# Patient Record
Sex: Female | Born: 1935
Health system: Southern US, Community
[De-identification: ages and names within clinical notes are randomized; demographics above are authoritative.]

## PROBLEM LIST (undated history)

## (undated) DIAGNOSIS — J449 Chronic obstructive pulmonary disease, unspecified: Secondary | ICD-10-CM

## (undated) DIAGNOSIS — E079 Disorder of thyroid, unspecified: Secondary | ICD-10-CM

## (undated) DIAGNOSIS — I10 Essential (primary) hypertension: Secondary | ICD-10-CM

---

## 2015-02-22 DIAGNOSIS — E780 Pure hypercholesterolemia: Secondary | ICD-10-CM | POA: Diagnosis not present

## 2015-02-22 DIAGNOSIS — E039 Hypothyroidism, unspecified: Secondary | ICD-10-CM | POA: Diagnosis not present

## 2015-02-22 DIAGNOSIS — M1990 Unspecified osteoarthritis, unspecified site: Secondary | ICD-10-CM | POA: Diagnosis not present

## 2015-03-01 DIAGNOSIS — E780 Pure hypercholesterolemia: Secondary | ICD-10-CM | POA: Diagnosis not present

## 2015-03-01 DIAGNOSIS — R634 Abnormal weight loss: Secondary | ICD-10-CM | POA: Diagnosis not present

## 2015-03-01 DIAGNOSIS — Z1389 Encounter for screening for other disorder: Secondary | ICD-10-CM | POA: Diagnosis not present

## 2015-03-01 DIAGNOSIS — I493 Ventricular premature depolarization: Secondary | ICD-10-CM | POA: Diagnosis not present

## 2015-03-01 DIAGNOSIS — E039 Hypothyroidism, unspecified: Secondary | ICD-10-CM | POA: Diagnosis not present

## 2015-03-01 DIAGNOSIS — R011 Cardiac murmur, unspecified: Secondary | ICD-10-CM | POA: Diagnosis not present

## 2015-03-08 DIAGNOSIS — R0989 Other specified symptoms and signs involving the circulatory and respiratory systems: Secondary | ICD-10-CM | POA: Diagnosis not present

## 2015-03-08 DIAGNOSIS — R011 Cardiac murmur, unspecified: Secondary | ICD-10-CM | POA: Diagnosis not present

## 2015-03-08 DIAGNOSIS — I517 Cardiomegaly: Secondary | ICD-10-CM | POA: Diagnosis not present

## 2015-03-23 DIAGNOSIS — R011 Cardiac murmur, unspecified: Secondary | ICD-10-CM | POA: Diagnosis not present

## 2015-03-23 DIAGNOSIS — R931 Abnormal findings on diagnostic imaging of heart and coronary circulation: Secondary | ICD-10-CM | POA: Diagnosis not present

## 2015-03-23 DIAGNOSIS — I6523 Occlusion and stenosis of bilateral carotid arteries: Secondary | ICD-10-CM | POA: Diagnosis not present

## 2015-03-23 DIAGNOSIS — I517 Cardiomegaly: Secondary | ICD-10-CM | POA: Diagnosis not present

## 2015-03-24 DIAGNOSIS — I10 Essential (primary) hypertension: Secondary | ICD-10-CM | POA: Diagnosis not present

## 2015-04-21 DIAGNOSIS — I739 Peripheral vascular disease, unspecified: Secondary | ICD-10-CM | POA: Insufficient documentation

## 2015-04-21 DIAGNOSIS — I517 Cardiomegaly: Secondary | ICD-10-CM | POA: Insufficient documentation

## 2015-04-21 DIAGNOSIS — I10 Essential (primary) hypertension: Secondary | ICD-10-CM | POA: Insufficient documentation

## 2015-04-21 DIAGNOSIS — I160 Hypertensive urgency: Secondary | ICD-10-CM | POA: Insufficient documentation

## 2015-08-23 DIAGNOSIS — E039 Hypothyroidism, unspecified: Secondary | ICD-10-CM | POA: Diagnosis not present

## 2015-08-23 DIAGNOSIS — R011 Cardiac murmur, unspecified: Secondary | ICD-10-CM | POA: Diagnosis not present

## 2015-08-23 DIAGNOSIS — E7800 Pure hypercholesterolemia, unspecified: Secondary | ICD-10-CM | POA: Diagnosis not present

## 2015-08-23 DIAGNOSIS — M1990 Unspecified osteoarthritis, unspecified site: Secondary | ICD-10-CM | POA: Diagnosis not present

## 2015-08-30 DIAGNOSIS — R634 Abnormal weight loss: Secondary | ICD-10-CM | POA: Diagnosis not present

## 2015-08-30 DIAGNOSIS — R011 Cardiac murmur, unspecified: Secondary | ICD-10-CM | POA: Diagnosis not present

## 2015-08-30 DIAGNOSIS — I493 Ventricular premature depolarization: Secondary | ICD-10-CM | POA: Diagnosis not present

## 2015-08-30 DIAGNOSIS — E039 Hypothyroidism, unspecified: Secondary | ICD-10-CM | POA: Diagnosis not present

## 2015-08-30 DIAGNOSIS — Z13220 Encounter for screening for lipoid disorders: Secondary | ICD-10-CM | POA: Diagnosis not present

## 2015-08-30 DIAGNOSIS — Z23 Encounter for immunization: Secondary | ICD-10-CM | POA: Diagnosis not present

## 2015-10-21 DIAGNOSIS — Z7982 Long term (current) use of aspirin: Secondary | ICD-10-CM | POA: Diagnosis not present

## 2015-10-21 DIAGNOSIS — Z79899 Other long term (current) drug therapy: Secondary | ICD-10-CM | POA: Diagnosis not present

## 2015-10-21 DIAGNOSIS — E039 Hypothyroidism, unspecified: Secondary | ICD-10-CM | POA: Diagnosis not present

## 2015-10-21 DIAGNOSIS — J40 Bronchitis, not specified as acute or chronic: Secondary | ICD-10-CM | POA: Diagnosis not present

## 2015-10-21 DIAGNOSIS — R195 Other fecal abnormalities: Secondary | ICD-10-CM | POA: Diagnosis not present

## 2015-10-21 DIAGNOSIS — E7800 Pure hypercholesterolemia, unspecified: Secondary | ICD-10-CM | POA: Diagnosis not present

## 2015-10-21 DIAGNOSIS — R531 Weakness: Secondary | ICD-10-CM | POA: Diagnosis not present

## 2015-10-21 DIAGNOSIS — D509 Iron deficiency anemia, unspecified: Secondary | ICD-10-CM | POA: Diagnosis not present

## 2015-10-21 DIAGNOSIS — R05 Cough: Secondary | ICD-10-CM | POA: Diagnosis not present

## 2015-10-21 DIAGNOSIS — I10 Essential (primary) hypertension: Secondary | ICD-10-CM | POA: Diagnosis not present

## 2015-10-22 DIAGNOSIS — J441 Chronic obstructive pulmonary disease with (acute) exacerbation: Secondary | ICD-10-CM | POA: Diagnosis not present

## 2015-10-22 DIAGNOSIS — R110 Nausea: Secondary | ICD-10-CM | POA: Diagnosis not present

## 2015-10-22 DIAGNOSIS — D649 Anemia, unspecified: Secondary | ICD-10-CM | POA: Diagnosis not present

## 2015-11-11 DIAGNOSIS — D649 Anemia, unspecified: Secondary | ICD-10-CM | POA: Diagnosis not present

## 2016-01-13 DIAGNOSIS — I10 Essential (primary) hypertension: Secondary | ICD-10-CM | POA: Diagnosis not present

## 2016-02-22 DIAGNOSIS — D649 Anemia, unspecified: Secondary | ICD-10-CM | POA: Diagnosis not present

## 2016-02-22 DIAGNOSIS — J441 Chronic obstructive pulmonary disease with (acute) exacerbation: Secondary | ICD-10-CM | POA: Diagnosis not present

## 2016-02-22 DIAGNOSIS — E039 Hypothyroidism, unspecified: Secondary | ICD-10-CM | POA: Diagnosis not present

## 2016-02-22 DIAGNOSIS — E7800 Pure hypercholesterolemia, unspecified: Secondary | ICD-10-CM | POA: Diagnosis not present

## 2016-02-22 DIAGNOSIS — D509 Iron deficiency anemia, unspecified: Secondary | ICD-10-CM | POA: Diagnosis not present

## 2016-02-24 DIAGNOSIS — I493 Ventricular premature depolarization: Secondary | ICD-10-CM | POA: Diagnosis not present

## 2016-02-24 DIAGNOSIS — D509 Iron deficiency anemia, unspecified: Secondary | ICD-10-CM | POA: Diagnosis not present

## 2016-02-24 DIAGNOSIS — R634 Abnormal weight loss: Secondary | ICD-10-CM | POA: Diagnosis not present

## 2016-02-24 DIAGNOSIS — Z8679 Personal history of other diseases of the circulatory system: Secondary | ICD-10-CM | POA: Diagnosis not present

## 2016-02-24 DIAGNOSIS — R011 Cardiac murmur, unspecified: Secondary | ICD-10-CM | POA: Diagnosis not present

## 2016-02-24 DIAGNOSIS — E039 Hypothyroidism, unspecified: Secondary | ICD-10-CM | POA: Diagnosis not present

## 2016-02-24 DIAGNOSIS — L299 Pruritus, unspecified: Secondary | ICD-10-CM | POA: Diagnosis not present

## 2016-03-24 DIAGNOSIS — I6523 Occlusion and stenosis of bilateral carotid arteries: Secondary | ICD-10-CM | POA: Diagnosis not present

## 2016-03-24 DIAGNOSIS — R0989 Other specified symptoms and signs involving the circulatory and respiratory systems: Secondary | ICD-10-CM | POA: Diagnosis not present

## 2016-03-24 DIAGNOSIS — R938 Abnormal findings on diagnostic imaging of other specified body structures: Secondary | ICD-10-CM | POA: Diagnosis not present

## 2016-03-24 DIAGNOSIS — I10 Essential (primary) hypertension: Secondary | ICD-10-CM | POA: Diagnosis not present

## 2016-03-24 DIAGNOSIS — I739 Peripheral vascular disease, unspecified: Secondary | ICD-10-CM | POA: Diagnosis not present

## 2016-06-23 DIAGNOSIS — D649 Anemia, unspecified: Secondary | ICD-10-CM | POA: Diagnosis not present

## 2016-06-23 DIAGNOSIS — E039 Hypothyroidism, unspecified: Secondary | ICD-10-CM | POA: Diagnosis not present

## 2016-06-23 DIAGNOSIS — D509 Iron deficiency anemia, unspecified: Secondary | ICD-10-CM | POA: Diagnosis not present

## 2016-06-23 DIAGNOSIS — E7800 Pure hypercholesterolemia, unspecified: Secondary | ICD-10-CM | POA: Diagnosis not present

## 2016-06-23 DIAGNOSIS — J441 Chronic obstructive pulmonary disease with (acute) exacerbation: Secondary | ICD-10-CM | POA: Diagnosis not present

## 2016-06-27 DIAGNOSIS — Z681 Body mass index (BMI) 19 or less, adult: Secondary | ICD-10-CM | POA: Diagnosis not present

## 2016-06-27 DIAGNOSIS — Z23 Encounter for immunization: Secondary | ICD-10-CM | POA: Diagnosis not present

## 2016-06-27 DIAGNOSIS — Z1212 Encounter for screening for malignant neoplasm of rectum: Secondary | ICD-10-CM | POA: Diagnosis not present

## 2016-06-27 DIAGNOSIS — E039 Hypothyroidism, unspecified: Secondary | ICD-10-CM | POA: Diagnosis not present

## 2016-06-27 DIAGNOSIS — I493 Ventricular premature depolarization: Secondary | ICD-10-CM | POA: Diagnosis not present

## 2016-06-27 DIAGNOSIS — R634 Abnormal weight loss: Secondary | ICD-10-CM | POA: Diagnosis not present

## 2016-06-27 DIAGNOSIS — R011 Cardiac murmur, unspecified: Secondary | ICD-10-CM | POA: Diagnosis not present

## 2016-10-24 DIAGNOSIS — J441 Chronic obstructive pulmonary disease with (acute) exacerbation: Secondary | ICD-10-CM | POA: Diagnosis not present

## 2016-10-24 DIAGNOSIS — E039 Hypothyroidism, unspecified: Secondary | ICD-10-CM | POA: Diagnosis not present

## 2016-10-24 DIAGNOSIS — D509 Iron deficiency anemia, unspecified: Secondary | ICD-10-CM | POA: Diagnosis not present

## 2016-10-24 DIAGNOSIS — E7800 Pure hypercholesterolemia, unspecified: Secondary | ICD-10-CM | POA: Diagnosis not present

## 2016-10-24 DIAGNOSIS — D649 Anemia, unspecified: Secondary | ICD-10-CM | POA: Diagnosis not present

## 2016-10-24 DIAGNOSIS — Z8679 Personal history of other diseases of the circulatory system: Secondary | ICD-10-CM | POA: Diagnosis not present

## 2016-10-27 DIAGNOSIS — L299 Pruritus, unspecified: Secondary | ICD-10-CM | POA: Diagnosis not present

## 2016-10-27 DIAGNOSIS — R011 Cardiac murmur, unspecified: Secondary | ICD-10-CM | POA: Diagnosis not present

## 2016-10-27 DIAGNOSIS — I493 Ventricular premature depolarization: Secondary | ICD-10-CM | POA: Diagnosis not present

## 2016-10-27 DIAGNOSIS — E7800 Pure hypercholesterolemia, unspecified: Secondary | ICD-10-CM | POA: Diagnosis not present

## 2016-10-27 DIAGNOSIS — E039 Hypothyroidism, unspecified: Secondary | ICD-10-CM | POA: Diagnosis not present

## 2016-10-27 DIAGNOSIS — I10 Essential (primary) hypertension: Secondary | ICD-10-CM | POA: Diagnosis not present

## 2016-10-27 DIAGNOSIS — R634 Abnormal weight loss: Secondary | ICD-10-CM | POA: Diagnosis not present

## 2016-10-27 DIAGNOSIS — D509 Iron deficiency anemia, unspecified: Secondary | ICD-10-CM | POA: Diagnosis not present

## 2017-03-08 DIAGNOSIS — D509 Iron deficiency anemia, unspecified: Secondary | ICD-10-CM | POA: Diagnosis not present

## 2017-03-08 DIAGNOSIS — J441 Chronic obstructive pulmonary disease with (acute) exacerbation: Secondary | ICD-10-CM | POA: Diagnosis not present

## 2017-03-08 DIAGNOSIS — E039 Hypothyroidism, unspecified: Secondary | ICD-10-CM | POA: Diagnosis not present

## 2017-03-08 DIAGNOSIS — R011 Cardiac murmur, unspecified: Secondary | ICD-10-CM | POA: Diagnosis not present

## 2017-03-08 DIAGNOSIS — E7800 Pure hypercholesterolemia, unspecified: Secondary | ICD-10-CM | POA: Diagnosis not present

## 2017-03-08 DIAGNOSIS — I10 Essential (primary) hypertension: Secondary | ICD-10-CM | POA: Diagnosis not present

## 2017-03-14 DIAGNOSIS — E7800 Pure hypercholesterolemia, unspecified: Secondary | ICD-10-CM | POA: Diagnosis not present

## 2017-03-14 DIAGNOSIS — I493 Ventricular premature depolarization: Secondary | ICD-10-CM | POA: Diagnosis not present

## 2017-03-14 DIAGNOSIS — Z23 Encounter for immunization: Secondary | ICD-10-CM | POA: Diagnosis not present

## 2017-03-14 DIAGNOSIS — L299 Pruritus, unspecified: Secondary | ICD-10-CM | POA: Diagnosis not present

## 2017-03-14 DIAGNOSIS — R634 Abnormal weight loss: Secondary | ICD-10-CM | POA: Diagnosis not present

## 2017-03-14 DIAGNOSIS — Z681 Body mass index (BMI) 19 or less, adult: Secondary | ICD-10-CM | POA: Diagnosis not present

## 2017-03-14 DIAGNOSIS — R011 Cardiac murmur, unspecified: Secondary | ICD-10-CM | POA: Diagnosis not present

## 2017-03-14 DIAGNOSIS — E039 Hypothyroidism, unspecified: Secondary | ICD-10-CM | POA: Diagnosis not present

## 2017-09-10 DIAGNOSIS — D509 Iron deficiency anemia, unspecified: Secondary | ICD-10-CM | POA: Diagnosis not present

## 2017-09-10 DIAGNOSIS — D649 Anemia, unspecified: Secondary | ICD-10-CM | POA: Diagnosis not present

## 2017-09-10 DIAGNOSIS — J441 Chronic obstructive pulmonary disease with (acute) exacerbation: Secondary | ICD-10-CM | POA: Diagnosis not present

## 2017-09-10 DIAGNOSIS — Z0000 Encounter for general adult medical examination without abnormal findings: Secondary | ICD-10-CM | POA: Diagnosis not present

## 2017-09-10 DIAGNOSIS — E7800 Pure hypercholesterolemia, unspecified: Secondary | ICD-10-CM | POA: Diagnosis not present

## 2017-09-10 DIAGNOSIS — I10 Essential (primary) hypertension: Secondary | ICD-10-CM | POA: Diagnosis not present

## 2017-09-10 DIAGNOSIS — E039 Hypothyroidism, unspecified: Secondary | ICD-10-CM | POA: Diagnosis not present

## 2017-09-10 DIAGNOSIS — Z681 Body mass index (BMI) 19 or less, adult: Secondary | ICD-10-CM | POA: Diagnosis not present

## 2017-09-10 DIAGNOSIS — I493 Ventricular premature depolarization: Secondary | ICD-10-CM | POA: Diagnosis not present

## 2017-09-13 DIAGNOSIS — Z681 Body mass index (BMI) 19 or less, adult: Secondary | ICD-10-CM | POA: Diagnosis not present

## 2017-09-13 DIAGNOSIS — L299 Pruritus, unspecified: Secondary | ICD-10-CM | POA: Diagnosis not present

## 2017-09-13 DIAGNOSIS — R011 Cardiac murmur, unspecified: Secondary | ICD-10-CM | POA: Diagnosis not present

## 2017-09-13 DIAGNOSIS — I10 Essential (primary) hypertension: Secondary | ICD-10-CM | POA: Diagnosis not present

## 2017-09-13 DIAGNOSIS — Z8679 Personal history of other diseases of the circulatory system: Secondary | ICD-10-CM | POA: Diagnosis not present

## 2017-09-13 DIAGNOSIS — R7301 Impaired fasting glucose: Secondary | ICD-10-CM | POA: Diagnosis not present

## 2017-09-13 DIAGNOSIS — D509 Iron deficiency anemia, unspecified: Secondary | ICD-10-CM | POA: Diagnosis not present

## 2017-09-13 DIAGNOSIS — E7800 Pure hypercholesterolemia, unspecified: Secondary | ICD-10-CM | POA: Diagnosis not present

## 2017-09-17 DIAGNOSIS — I6523 Occlusion and stenosis of bilateral carotid arteries: Secondary | ICD-10-CM | POA: Diagnosis not present

## 2017-09-17 DIAGNOSIS — I493 Ventricular premature depolarization: Secondary | ICD-10-CM | POA: Diagnosis not present

## 2018-03-06 DIAGNOSIS — E039 Hypothyroidism, unspecified: Secondary | ICD-10-CM | POA: Diagnosis not present

## 2018-03-06 DIAGNOSIS — R011 Cardiac murmur, unspecified: Secondary | ICD-10-CM | POA: Diagnosis not present

## 2018-03-06 DIAGNOSIS — E7800 Pure hypercholesterolemia, unspecified: Secondary | ICD-10-CM | POA: Diagnosis not present

## 2018-03-06 DIAGNOSIS — D509 Iron deficiency anemia, unspecified: Secondary | ICD-10-CM | POA: Diagnosis not present

## 2018-03-06 DIAGNOSIS — J441 Chronic obstructive pulmonary disease with (acute) exacerbation: Secondary | ICD-10-CM | POA: Diagnosis not present

## 2018-03-06 DIAGNOSIS — Z8679 Personal history of other diseases of the circulatory system: Secondary | ICD-10-CM | POA: Diagnosis not present

## 2018-03-06 DIAGNOSIS — M1990 Unspecified osteoarthritis, unspecified site: Secondary | ICD-10-CM | POA: Diagnosis not present

## 2018-03-12 DIAGNOSIS — I10 Essential (primary) hypertension: Secondary | ICD-10-CM | POA: Diagnosis not present

## 2018-03-12 DIAGNOSIS — E7800 Pure hypercholesterolemia, unspecified: Secondary | ICD-10-CM | POA: Diagnosis not present

## 2018-03-12 DIAGNOSIS — L299 Pruritus, unspecified: Secondary | ICD-10-CM | POA: Diagnosis not present

## 2018-03-12 DIAGNOSIS — E039 Hypothyroidism, unspecified: Secondary | ICD-10-CM | POA: Diagnosis not present

## 2018-03-12 DIAGNOSIS — Z681 Body mass index (BMI) 19 or less, adult: Secondary | ICD-10-CM | POA: Diagnosis not present

## 2018-03-12 DIAGNOSIS — R011 Cardiac murmur, unspecified: Secondary | ICD-10-CM | POA: Diagnosis not present

## 2018-03-12 DIAGNOSIS — R634 Abnormal weight loss: Secondary | ICD-10-CM | POA: Diagnosis not present

## 2018-03-12 DIAGNOSIS — I493 Ventricular premature depolarization: Secondary | ICD-10-CM | POA: Diagnosis not present

## 2018-09-06 DIAGNOSIS — I10 Essential (primary) hypertension: Secondary | ICD-10-CM | POA: Diagnosis not present

## 2018-09-06 DIAGNOSIS — D509 Iron deficiency anemia, unspecified: Secondary | ICD-10-CM | POA: Diagnosis not present

## 2018-09-06 DIAGNOSIS — R011 Cardiac murmur, unspecified: Secondary | ICD-10-CM | POA: Diagnosis not present

## 2018-09-06 DIAGNOSIS — R7301 Impaired fasting glucose: Secondary | ICD-10-CM | POA: Diagnosis not present

## 2018-09-06 DIAGNOSIS — E039 Hypothyroidism, unspecified: Secondary | ICD-10-CM | POA: Diagnosis not present

## 2018-09-06 DIAGNOSIS — E7800 Pure hypercholesterolemia, unspecified: Secondary | ICD-10-CM | POA: Diagnosis not present

## 2018-09-06 DIAGNOSIS — J441 Chronic obstructive pulmonary disease with (acute) exacerbation: Secondary | ICD-10-CM | POA: Diagnosis not present

## 2018-09-09 DIAGNOSIS — I493 Ventricular premature depolarization: Secondary | ICD-10-CM | POA: Diagnosis not present

## 2018-09-09 DIAGNOSIS — R011 Cardiac murmur, unspecified: Secondary | ICD-10-CM | POA: Diagnosis not present

## 2018-09-09 DIAGNOSIS — L299 Pruritus, unspecified: Secondary | ICD-10-CM | POA: Diagnosis not present

## 2018-09-09 DIAGNOSIS — J449 Chronic obstructive pulmonary disease, unspecified: Secondary | ICD-10-CM | POA: Diagnosis not present

## 2018-09-09 DIAGNOSIS — E039 Hypothyroidism, unspecified: Secondary | ICD-10-CM | POA: Diagnosis not present

## 2018-09-09 DIAGNOSIS — Z681 Body mass index (BMI) 19 or less, adult: Secondary | ICD-10-CM | POA: Diagnosis not present

## 2018-09-09 DIAGNOSIS — I10 Essential (primary) hypertension: Secondary | ICD-10-CM | POA: Diagnosis not present

## 2018-09-09 DIAGNOSIS — R05 Cough: Secondary | ICD-10-CM | POA: Diagnosis not present

## 2019-03-06 DIAGNOSIS — R7301 Impaired fasting glucose: Secondary | ICD-10-CM | POA: Diagnosis not present

## 2019-03-06 DIAGNOSIS — E039 Hypothyroidism, unspecified: Secondary | ICD-10-CM | POA: Diagnosis not present

## 2019-03-06 DIAGNOSIS — E7800 Pure hypercholesterolemia, unspecified: Secondary | ICD-10-CM | POA: Diagnosis not present

## 2019-03-06 DIAGNOSIS — J449 Chronic obstructive pulmonary disease, unspecified: Secondary | ICD-10-CM | POA: Diagnosis not present

## 2019-03-06 DIAGNOSIS — I10 Essential (primary) hypertension: Secondary | ICD-10-CM | POA: Diagnosis not present

## 2019-03-06 DIAGNOSIS — D509 Iron deficiency anemia, unspecified: Secondary | ICD-10-CM | POA: Diagnosis not present

## 2019-03-11 DIAGNOSIS — E039 Hypothyroidism, unspecified: Secondary | ICD-10-CM | POA: Diagnosis not present

## 2019-03-11 DIAGNOSIS — Z681 Body mass index (BMI) 19 or less, adult: Secondary | ICD-10-CM | POA: Diagnosis not present

## 2019-03-11 DIAGNOSIS — J449 Chronic obstructive pulmonary disease, unspecified: Secondary | ICD-10-CM | POA: Diagnosis not present

## 2019-03-11 DIAGNOSIS — L299 Pruritus, unspecified: Secondary | ICD-10-CM | POA: Diagnosis not present

## 2019-03-11 DIAGNOSIS — R011 Cardiac murmur, unspecified: Secondary | ICD-10-CM | POA: Diagnosis not present

## 2019-03-11 DIAGNOSIS — I493 Ventricular premature depolarization: Secondary | ICD-10-CM | POA: Diagnosis not present

## 2019-03-11 DIAGNOSIS — I10 Essential (primary) hypertension: Secondary | ICD-10-CM | POA: Diagnosis not present

## 2019-03-11 DIAGNOSIS — M791 Myalgia, unspecified site: Secondary | ICD-10-CM | POA: Diagnosis not present

## 2019-08-15 DIAGNOSIS — E782 Mixed hyperlipidemia: Secondary | ICD-10-CM | POA: Diagnosis not present

## 2019-08-15 DIAGNOSIS — I10 Essential (primary) hypertension: Secondary | ICD-10-CM | POA: Diagnosis not present

## 2019-09-04 DIAGNOSIS — R7301 Impaired fasting glucose: Secondary | ICD-10-CM | POA: Diagnosis not present

## 2019-09-04 DIAGNOSIS — E039 Hypothyroidism, unspecified: Secondary | ICD-10-CM | POA: Diagnosis not present

## 2019-09-04 DIAGNOSIS — I10 Essential (primary) hypertension: Secondary | ICD-10-CM | POA: Diagnosis not present

## 2019-09-04 DIAGNOSIS — J441 Chronic obstructive pulmonary disease with (acute) exacerbation: Secondary | ICD-10-CM | POA: Diagnosis not present

## 2019-09-04 DIAGNOSIS — E7800 Pure hypercholesterolemia, unspecified: Secondary | ICD-10-CM | POA: Diagnosis not present

## 2019-09-08 DIAGNOSIS — I10 Essential (primary) hypertension: Secondary | ICD-10-CM | POA: Diagnosis not present

## 2019-09-08 DIAGNOSIS — R05 Cough: Secondary | ICD-10-CM | POA: Diagnosis not present

## 2019-09-08 DIAGNOSIS — Z8679 Personal history of other diseases of the circulatory system: Secondary | ICD-10-CM | POA: Diagnosis not present

## 2019-09-08 DIAGNOSIS — L299 Pruritus, unspecified: Secondary | ICD-10-CM | POA: Diagnosis not present

## 2019-09-08 DIAGNOSIS — D509 Iron deficiency anemia, unspecified: Secondary | ICD-10-CM | POA: Diagnosis not present

## 2019-09-08 DIAGNOSIS — R011 Cardiac murmur, unspecified: Secondary | ICD-10-CM | POA: Diagnosis not present

## 2019-09-08 DIAGNOSIS — I493 Ventricular premature depolarization: Secondary | ICD-10-CM | POA: Diagnosis not present

## 2019-09-08 DIAGNOSIS — J449 Chronic obstructive pulmonary disease, unspecified: Secondary | ICD-10-CM | POA: Diagnosis not present

## 2019-09-10 ENCOUNTER — Other Ambulatory Visit: Payer: Self-pay

## 2019-09-15 DIAGNOSIS — I10 Essential (primary) hypertension: Secondary | ICD-10-CM | POA: Diagnosis not present

## 2019-09-15 DIAGNOSIS — E7800 Pure hypercholesterolemia, unspecified: Secondary | ICD-10-CM | POA: Diagnosis not present

## 2019-12-12 DIAGNOSIS — I10 Essential (primary) hypertension: Secondary | ICD-10-CM | POA: Diagnosis not present

## 2019-12-12 DIAGNOSIS — J449 Chronic obstructive pulmonary disease, unspecified: Secondary | ICD-10-CM | POA: Diagnosis not present

## 2019-12-12 DIAGNOSIS — E7801 Familial hypercholesterolemia: Secondary | ICD-10-CM | POA: Diagnosis not present

## 2019-12-12 DIAGNOSIS — E039 Hypothyroidism, unspecified: Secondary | ICD-10-CM | POA: Diagnosis not present

## 2020-03-04 DIAGNOSIS — E039 Hypothyroidism, unspecified: Secondary | ICD-10-CM | POA: Diagnosis not present

## 2020-03-04 DIAGNOSIS — E7800 Pure hypercholesterolemia, unspecified: Secondary | ICD-10-CM | POA: Diagnosis not present

## 2020-03-04 DIAGNOSIS — J441 Chronic obstructive pulmonary disease with (acute) exacerbation: Secondary | ICD-10-CM | POA: Diagnosis not present

## 2020-03-04 DIAGNOSIS — D649 Anemia, unspecified: Secondary | ICD-10-CM | POA: Diagnosis not present

## 2020-03-04 DIAGNOSIS — R7301 Impaired fasting glucose: Secondary | ICD-10-CM | POA: Diagnosis not present

## 2020-03-04 DIAGNOSIS — J449 Chronic obstructive pulmonary disease, unspecified: Secondary | ICD-10-CM | POA: Diagnosis not present

## 2020-03-04 DIAGNOSIS — I10 Essential (primary) hypertension: Secondary | ICD-10-CM | POA: Diagnosis not present

## 2020-03-04 DIAGNOSIS — R011 Cardiac murmur, unspecified: Secondary | ICD-10-CM | POA: Diagnosis not present

## 2020-03-08 DIAGNOSIS — I10 Essential (primary) hypertension: Secondary | ICD-10-CM | POA: Diagnosis not present

## 2020-03-08 DIAGNOSIS — Z0000 Encounter for general adult medical examination without abnormal findings: Secondary | ICD-10-CM | POA: Diagnosis not present

## 2020-03-08 DIAGNOSIS — R011 Cardiac murmur, unspecified: Secondary | ICD-10-CM | POA: Diagnosis not present

## 2020-03-08 DIAGNOSIS — E039 Hypothyroidism, unspecified: Secondary | ICD-10-CM | POA: Diagnosis not present

## 2020-03-08 DIAGNOSIS — I493 Ventricular premature depolarization: Secondary | ICD-10-CM | POA: Diagnosis not present

## 2020-03-08 DIAGNOSIS — Z681 Body mass index (BMI) 19 or less, adult: Secondary | ICD-10-CM | POA: Diagnosis not present

## 2020-03-08 DIAGNOSIS — R634 Abnormal weight loss: Secondary | ICD-10-CM | POA: Diagnosis not present

## 2020-03-08 DIAGNOSIS — J449 Chronic obstructive pulmonary disease, unspecified: Secondary | ICD-10-CM | POA: Diagnosis not present

## 2020-08-18 DIAGNOSIS — E039 Hypothyroidism, unspecified: Secondary | ICD-10-CM | POA: Diagnosis not present

## 2020-08-18 DIAGNOSIS — I10 Essential (primary) hypertension: Secondary | ICD-10-CM | POA: Diagnosis not present

## 2020-08-18 DIAGNOSIS — E559 Vitamin D deficiency, unspecified: Secondary | ICD-10-CM | POA: Diagnosis not present

## 2020-08-18 DIAGNOSIS — J449 Chronic obstructive pulmonary disease, unspecified: Secondary | ICD-10-CM | POA: Diagnosis not present

## 2020-08-18 DIAGNOSIS — R413 Other amnesia: Secondary | ICD-10-CM | POA: Diagnosis not present

## 2020-08-18 DIAGNOSIS — Z681 Body mass index (BMI) 19 or less, adult: Secondary | ICD-10-CM | POA: Diagnosis not present

## 2020-08-18 DIAGNOSIS — G629 Polyneuropathy, unspecified: Secondary | ICD-10-CM | POA: Diagnosis not present

## 2020-09-02 DIAGNOSIS — I10 Essential (primary) hypertension: Secondary | ICD-10-CM | POA: Diagnosis not present

## 2020-09-02 DIAGNOSIS — Z8679 Personal history of other diseases of the circulatory system: Secondary | ICD-10-CM | POA: Diagnosis not present

## 2020-09-02 DIAGNOSIS — R011 Cardiac murmur, unspecified: Secondary | ICD-10-CM | POA: Diagnosis not present

## 2020-09-02 DIAGNOSIS — E039 Hypothyroidism, unspecified: Secondary | ICD-10-CM | POA: Diagnosis not present

## 2020-09-02 DIAGNOSIS — E7801 Familial hypercholesterolemia: Secondary | ICD-10-CM | POA: Diagnosis not present

## 2020-09-02 DIAGNOSIS — R7301 Impaired fasting glucose: Secondary | ICD-10-CM | POA: Diagnosis not present

## 2020-09-02 DIAGNOSIS — I493 Ventricular premature depolarization: Secondary | ICD-10-CM | POA: Diagnosis not present

## 2020-09-02 DIAGNOSIS — J449 Chronic obstructive pulmonary disease, unspecified: Secondary | ICD-10-CM | POA: Diagnosis not present

## 2020-12-24 DIAGNOSIS — I10 Essential (primary) hypertension: Secondary | ICD-10-CM | POA: Diagnosis not present

## 2020-12-24 DIAGNOSIS — E7849 Other hyperlipidemia: Secondary | ICD-10-CM | POA: Diagnosis not present

## 2020-12-24 DIAGNOSIS — R5383 Other fatigue: Secondary | ICD-10-CM | POA: Diagnosis not present

## 2020-12-24 DIAGNOSIS — E782 Mixed hyperlipidemia: Secondary | ICD-10-CM | POA: Diagnosis not present

## 2020-12-24 DIAGNOSIS — R011 Cardiac murmur, unspecified: Secondary | ICD-10-CM | POA: Diagnosis not present

## 2020-12-24 DIAGNOSIS — R413 Other amnesia: Secondary | ICD-10-CM | POA: Diagnosis not present

## 2020-12-24 DIAGNOSIS — Z681 Body mass index (BMI) 19 or less, adult: Secondary | ICD-10-CM | POA: Diagnosis not present

## 2021-01-06 ENCOUNTER — Inpatient Hospital Stay (HOSPITAL_COMMUNITY): Payer: Medicare HMO

## 2021-01-06 ENCOUNTER — Inpatient Hospital Stay (HOSPITAL_COMMUNITY)
Admission: EM | Admit: 2021-01-06 | Discharge: 2021-01-09 | DRG: 071 | Disposition: A | Discharge status: Home Health | Payer: Medicare HMO | Attending: Internal Medicine | Admitting: Internal Medicine

## 2021-01-06 ENCOUNTER — Emergency Department (HOSPITAL_COMMUNITY): Payer: Medicare HMO

## 2021-01-06 ENCOUNTER — Encounter (HOSPITAL_COMMUNITY): Payer: Self-pay | Admitting: *Deleted

## 2021-01-06 ENCOUNTER — Other Ambulatory Visit: Payer: Self-pay

## 2021-01-06 DIAGNOSIS — Z87891 Personal history of nicotine dependence: Secondary | ICD-10-CM | POA: Diagnosis not present

## 2021-01-06 DIAGNOSIS — E785 Hyperlipidemia, unspecified: Secondary | ICD-10-CM | POA: Diagnosis not present

## 2021-01-06 DIAGNOSIS — E079 Disorder of thyroid, unspecified: Secondary | ICD-10-CM | POA: Diagnosis present

## 2021-01-06 DIAGNOSIS — G9341 Metabolic encephalopathy: Secondary | ICD-10-CM

## 2021-01-06 DIAGNOSIS — Z66 Do not resuscitate: Secondary | ICD-10-CM | POA: Diagnosis present

## 2021-01-06 DIAGNOSIS — R4182 Altered mental status, unspecified: Secondary | ICD-10-CM | POA: Diagnosis not present

## 2021-01-06 DIAGNOSIS — R630 Anorexia: Secondary | ICD-10-CM | POA: Diagnosis present

## 2021-01-06 DIAGNOSIS — F0390 Unspecified dementia without behavioral disturbance: Secondary | ICD-10-CM | POA: Diagnosis present

## 2021-01-06 DIAGNOSIS — E876 Hypokalemia: Secondary | ICD-10-CM | POA: Diagnosis present

## 2021-01-06 DIAGNOSIS — Z20822 Contact with and (suspected) exposure to covid-19: Secondary | ICD-10-CM | POA: Diagnosis present

## 2021-01-06 DIAGNOSIS — R531 Weakness: Secondary | ICD-10-CM | POA: Diagnosis not present

## 2021-01-06 DIAGNOSIS — E039 Hypothyroidism, unspecified: Secondary | ICD-10-CM | POA: Diagnosis present

## 2021-01-06 DIAGNOSIS — J449 Chronic obstructive pulmonary disease, unspecified: Secondary | ICD-10-CM | POA: Diagnosis not present

## 2021-01-06 DIAGNOSIS — E871 Hypo-osmolality and hyponatremia: Secondary | ICD-10-CM | POA: Diagnosis present

## 2021-01-06 DIAGNOSIS — I10 Essential (primary) hypertension: Secondary | ICD-10-CM | POA: Diagnosis not present

## 2021-01-06 HISTORY — DX: Disorder of thyroid, unspecified: E07.9

## 2021-01-06 HISTORY — DX: Metabolic encephalopathy: G93.41

## 2021-01-06 HISTORY — DX: Essential (primary) hypertension: I10

## 2021-01-06 HISTORY — DX: Chronic obstructive pulmonary disease, unspecified: J44.9

## 2021-01-06 LAB — CBC WITH DIFFERENTIAL/PLATELET
Abs Immature Granulocytes: 0.03 10*3/uL (ref 0.00–0.07)
Basophils Absolute: 0 10*3/uL (ref 0.0–0.1)
Basophils Relative: 1 %
Eosinophils Absolute: 0 10*3/uL (ref 0.0–0.5)
Eosinophils Relative: 1 %
HCT: 33.3 % — ABNORMAL LOW (ref 36.0–46.0)
Hemoglobin: 12.4 g/dL (ref 12.0–15.0)
Immature Granulocytes: 1 %
Lymphocytes Relative: 10 %
Lymphs Abs: 0.5 10*3/uL — ABNORMAL LOW (ref 0.7–4.0)
MCH: 33.9 pg (ref 26.0–34.0)
MCHC: 37.2 g/dL — ABNORMAL HIGH (ref 30.0–36.0)
MCV: 91 fL (ref 80.0–100.0)
Monocytes Absolute: 0.6 10*3/uL (ref 0.1–1.0)
Monocytes Relative: 10 %
Neutro Abs: 4.4 10*3/uL (ref 1.7–7.7)
Neutrophils Relative %: 77 %
Platelets: 209 10*3/uL (ref 150–400)
RBC: 3.66 MIL/uL — ABNORMAL LOW (ref 3.87–5.11)
RDW: 11.7 % (ref 11.5–15.5)
WBC: 5.6 10*3/uL (ref 4.0–10.5)
nRBC: 0 % (ref 0.0–0.2)

## 2021-01-06 LAB — COMPREHENSIVE METABOLIC PANEL
ALT: 28 U/L (ref 0–44)
AST: 41 U/L (ref 15–41)
Albumin: 4.1 g/dL (ref 3.5–5.0)
Alkaline Phosphatase: 34 U/L — ABNORMAL LOW (ref 38–126)
Anion gap: 10 (ref 5–15)
BUN: 11 mg/dL (ref 8–23)
CO2: 25 mmol/L (ref 22–32)
Calcium: 8.8 mg/dL — ABNORMAL LOW (ref 8.9–10.3)
Chloride: 80 mmol/L — ABNORMAL LOW (ref 98–111)
Creatinine, Ser: 0.75 mg/dL (ref 0.44–1.00)
GFR, Estimated: >60 mL/min (ref >60)
Glucose, Bld: 98 mg/dL (ref 70–99)
Potassium: 3.2 mmol/L — ABNORMAL LOW (ref 3.5–5.1)
Sodium: 115 mmol/L — CL (ref 135–145)
Total Bilirubin: 1.2 mg/dL (ref 0.3–1.2)
Total Protein: 6.3 g/dL — ABNORMAL LOW (ref 6.5–8.1)

## 2021-01-06 LAB — TROPONIN I (HIGH SENSITIVITY)
Troponin I (High Sensitivity): 44 ng/L — ABNORMAL HIGH (ref <18)
Troponin I (High Sensitivity): 39 ng/L — ABNORMAL HIGH (ref <18)

## 2021-01-06 LAB — URINALYSIS, ROUTINE W REFLEX MICROSCOPIC
Bilirubin Urine: NEGATIVE
Glucose, UA: NEGATIVE mg/dL
Hgb urine dipstick: NEGATIVE
Ketones, ur: 5 mg/dL — AB
Leukocytes,Ua: NEGATIVE
Nitrite: NEGATIVE
Protein, ur: NEGATIVE mg/dL
Specific Gravity, Urine: 1.004 — ABNORMAL LOW (ref 1.005–1.030)
pH: 7 (ref 5.0–8.0)

## 2021-01-06 LAB — OSMOLALITY: Osmolality: 245 mOsm/kg — CL (ref 275–295)

## 2021-01-06 LAB — SODIUM: Sodium: 118 mmol/L — CL (ref 135–145)

## 2021-01-06 LAB — TSH: TSH: 2.774 u[IU]/mL (ref 0.350–4.500)

## 2021-01-06 MED ORDER — IPRATROPIUM-ALBUTEROL 0.5-2.5 (3) MG/3ML IN SOLN: 3.0000 mL | Freq: Four times a day (QID) | RESPIRATORY_TRACT | Status: DC | PRN

## 2021-01-06 MED ORDER — LABETALOL HCL 5 MG/ML IV SOLN: 10.0000 mg | INTRAVENOUS | Status: DC | PRN

## 2021-01-06 MED ORDER — SODIUM CHLORIDE 0.9 % IV BOLUS (SEPSIS)
1000.0000 mL | Freq: Once | INTRAVENOUS | Status: AC
  Administered 2021-01-06: 1000 mL via INTRAVENOUS

## 2021-01-06 MED ORDER — SODIUM CHLORIDE 0.9 % IV BOLUS
500.0000 mL | Freq: Once | INTRAVENOUS | Status: AC
  Administered 2021-01-06: 500 mL via INTRAVENOUS

## 2021-01-06 MED ORDER — ONDANSETRON HCL 4 MG/2ML IJ SOLN
4.0000 mg | Freq: Four times a day (QID) | INTRAMUSCULAR | Status: DC | PRN
  Administered 2021-01-07: 4 mg via INTRAVENOUS
  Filled 2021-01-06: qty 2

## 2021-01-06 MED ORDER — ACETAMINOPHEN 325 MG PO TABS
650.0000 mg | ORAL_TABLET | Freq: Four times a day (QID) | ORAL | Status: DC | PRN
  Administered 2021-01-07: 650 mg via ORAL
  Filled 2021-01-06: qty 2

## 2021-01-06 MED ORDER — ENOXAPARIN SODIUM 40 MG/0.4ML ~~LOC~~ SOLN
40.0000 mg | SUBCUTANEOUS | Status: DC
  Administered 2021-01-06 – 2021-01-08 (×3): 40 mg via SUBCUTANEOUS
  Filled 2021-01-06 (×4): qty 0.4

## 2021-01-06 MED ORDER — ACETAMINOPHEN 650 MG RE SUPP: 650.0000 mg | Freq: Four times a day (QID) | RECTAL | Status: DC | PRN

## 2021-01-06 MED ORDER — SODIUM CHLORIDE 0.9 % IV SOLN: INTRAVENOUS | Status: DC

## 2021-01-06 MED ORDER — ONDANSETRON HCL 4 MG PO TABS: 4.0000 mg | ORAL_TABLET | Freq: Four times a day (QID) | ORAL | Status: DC | PRN

## 2021-01-06 MED ORDER — POTASSIUM CHLORIDE CRYS ER 20 MEQ PO TBCR
40.0000 meq | EXTENDED_RELEASE_TABLET | Freq: Once | ORAL | Status: AC
  Administered 2021-01-06: 40 meq via ORAL
  Filled 2021-01-06: qty 2

## 2021-01-06 MED ORDER — MELATONIN 3 MG PO TABS
6.0000 mg | ORAL_TABLET | Freq: Every day | ORAL | Status: DC
  Administered 2021-01-06 – 2021-01-08 (×3): 6 mg via ORAL
  Filled 2021-01-06 (×3): qty 2

## 2021-01-06 NOTE — ED Notes (Signed)
Patient to CT.

## 2021-01-06 NOTE — Progress Notes (Signed)
Caswell Corwin made aware of sodium level via Amion chat. No new orders at this time.

## 2021-01-06 NOTE — Progress Notes (Signed)
Date and time results received: 01/06/21 1500 (use smartphrase ".now" to insert current time)  Test: blood osmolality  Critical Value: 245  Name of Provider Notified: Maurilio Lovely DO  Orders Received? Or Actions Taken?: none received

## 2021-01-06 NOTE — ED Provider Notes (Signed)
[facility] EMERGENCY DEPARTMENT Provider Note   CSN: [id number] Arrival date & time: 01/06/21  1212     History Chief Complaint  Patient presents with  . Weakness    Theresa Bush is a 87 y.o. female.  Patient presents with weakness that progressed over the last week.  And some confusion.  The history is provided by a relative. No language interpreter was used.  Weakness Severity:  Moderate Onset quality:  Sudden Timing:  Constant Progression:  Worsening Chronicity:  Recurrent Context: not alcohol use   Relieved by:  Nothing Worsened by:  Nothing Ineffective treatments:  None tried Associated symptoms: no abdominal pain, no chest pain, no cough, no diarrhea, no frequency, no headaches and no seizures        Past Medical History:  Diagnosis Date  . COPD (chronic obstructive pulmonary disease) (HCC)   . Hypertension   . Thyroid disease     There are no problems to display for this patient.      OB History   No obstetric history on file.     No family history on file.  Social History   Tobacco Use  . Smoking status: Former Games developer  . Smokeless tobacco: Never Used  Substance Use Topics  . Alcohol use: Not Currently  . Drug use: Never    Home Medications Prior to Admission medications   Not on File    Allergies    Patient has no known allergies.  Review of Systems   Review of Systems  Constitutional: Negative for appetite change and fatigue.  HENT: Negative for congestion, ear discharge and sinus pressure.   Eyes: Negative for discharge.  Respiratory: Negative for cough.   Cardiovascular: Negative for chest pain.  Gastrointestinal: Negative for abdominal pain and diarrhea.  Genitourinary: Negative for frequency and hematuria.  Musculoskeletal: Negative for back pain.  Skin: Negative for rash.  Neurological: Positive for weakness. Negative for seizures and headaches.  Psychiatric/Behavioral: Negative for hallucinations.    Physical  Exam Updated Vital Signs BP (!) 166/69   Pulse 74   Temp 97.9 F (36.6 C)   Resp 15   SpO2 100%   Physical Exam Vitals and nursing note reviewed.  Constitutional:      Appearance: She is well-developed.  HENT:     Head: Normocephalic.     Nose: Nose normal.  Eyes:     General: No scleral icterus.    Conjunctiva/sclera: Conjunctivae normal.  Neck:     Thyroid: No thyromegaly.  Cardiovascular:     Rate and Rhythm: Normal rate and regular rhythm.     Heart sounds: No murmur heard. No friction rub. No gallop.   Pulmonary:     Breath sounds: No stridor. No wheezing or rales.  Chest:     Chest wall: No tenderness.  Abdominal:     General: There is no distension.     Tenderness: There is no abdominal tenderness. There is no rebound.  Musculoskeletal:        General: Normal range of motion.     Cervical back: Neck supple.  Lymphadenopathy:     Cervical: No cervical adenopathy.  Skin:    Findings: No erythema or rash.  Neurological:     Mental Status: She is alert.     Motor: No abnormal muscle tone.     Coordination: Coordination normal.     Comments: Oriented to person place only  Psychiatric:        Behavior: Behavior normal.  ED Results / Procedures / Treatments   Labs (all labs ordered are listed, but only abnormal results are displayed) Labs Reviewed  CBC WITH DIFFERENTIAL/PLATELET - Abnormal; Notable for the following components:      Result Value   RBC 3.66 (*)    HCT 33.3 (*)    MCHC 37.2 (*)    Lymphs Abs 0.5 (*)    All other components within normal limits  COMPREHENSIVE METABOLIC PANEL - Abnormal; Notable for the following components:   Sodium 115 (*)    Potassium 3.2 (*)    Chloride 80 (*)    Calcium 8.8 (*)    Total Protein 6.3 (*)    Alkaline Phosphatase 34 (*)    All other components within normal limits  TROPONIN I (HIGH SENSITIVITY) - Abnormal; Notable for the following components:   Troponin I (High Sensitivity) 44 (*)    All other  components within normal limits  TSH  URINALYSIS, ROUTINE W REFLEX MICROSCOPIC  TROPONIN I (HIGH SENSITIVITY)    EKG None  Radiology DG Chest Port 1 View  Result Date: 01/06/2021 CLINICAL DATA:  Weakness. EXAM: PORTABLE CHEST 1 VIEW COMPARISON:  None. FINDINGS: The heart size and mediastinal contours are within normal limits. Both lungs are clear. The visualized skeletal structures are unremarkable. IMPRESSION: No active disease. Aortic Atherosclerosis (ICD10-I70.0). Electronically Signed   By: Lupita Raider M.D.   On: 01/06/2021 13:45    Procedures Procedures   Medications Ordered in ED Medications  sodium chloride 0.9 % bolus 1,000 mL (1,000 mLs Intravenous New Bag/Given 01/06/21 1411)  sodium chloride 0.9 % bolus 500 mL (0 mLs Intravenous Stopped 01/06/21 1403)    ED Course  I have reviewed the triage vital signs and the nursing notes.  Pertinent labs & imaging results that were available during my care of the patient were reviewed by me and considered in my medical decision making (see chart for details). CRITICAL CARE Performed by: Bethann Berkshire Total critical care time: 45 minutes Critical care time was exclusive of separately billable procedures and treating other patients. Critical care was necessary to treat or prevent imminent or life-threatening deterioration. Critical care was time spent personally by me on the following activities: development of treatment plan with patient and/or surrogate as well as nursing, discussions with consultants, evaluation of patient's response to treatment, examination of patient, obtaining history from patient or surrogate, ordering and performing treatments and interventions, ordering and review of laboratory studies, ordering and review of radiographic studies, pulse oximetry and re-evaluation of patient's condition.    MDM Rules/Calculators/A&P                            patient with hyponatremia.  She will be admitted to  medicine Final Clinical Impression(s) / ED Diagnoses Final diagnoses:  Hyponatremia    Rx / DC Orders ED Discharge Orders    None       Bethann Berkshire, MD 01/06/21 1417

## 2021-01-06 NOTE — H&P (Signed)
History and Physical    Theresa Bush YCX:[id number] DOB: [birth year] DOA: 01/06/2021  PCP: Estanislado Pandy, MD   Patient coming from: Home  Chief Complaint: Weakness/confusion  HPI: Theresa Bush is a 87 y.o. female with medical history significant for COPD, hypertension, dyslipidemia, hypothyroidism, and likely dementia who presented to the ED with worsening confusion, weakness, nausea that has not been persisting for approximately 2 weeks according to daughter at bedside.  She has had poor appetite and little oral intake recently. She denies abdominal pain or vomiting and continues to have bowel movements on a daily basis.  No fevers or chills noted.  She has been taking her home medications regularly and was recently noted to have low sodium levels in her PCP office.  She was encouraged to drink some Gatorade and reduce her water intake at home.  She denies any chest pain, shortness of breath, or dysuria.  Patient provides some of the history and she is minimally confused with daughter at bedside.   ED Course: Stable vital signs noted, but now with some elevated blood pressure readings at bedside.  Sodium level is 115 and potassium is 3.2.  Bolus of 1.5 L normal saline initiated.  Chest x-ray with no acute findings and urine analysis is pending.  Review of Systems: Difficult to fully obtain given patient condition.  Past Medical History:  Diagnosis Date  . COPD (chronic obstructive pulmonary disease) (HCC)   . Hypertension   . Thyroid disease      reports that she has quit smoking. She has never used smokeless tobacco. She reports previous alcohol use. She reports that she does not use drugs.  No Known Allergies  No family history on file.  Prior to Admission medications   Not on File    Physical Exam: Vitals:   01/06/21 1227 01/06/21 1300 01/06/21 1330  BP: (!) 184/84 (!) 150/73 (!) 166/69  Pulse: 73 69 74  Resp: 18 (!) 21 15  Temp: 97.9 F (36.6 C)    SpO2:  97% 99% 100%    Constitutional: NAD, calm, comfortable, confused Vitals:   01/06/21 1227 01/06/21 1300 01/06/21 1330  BP: (!) 184/84 (!) 150/73 (!) 166/69  Pulse: 73 69 74  Resp: 18 (!) 21 15  Temp: 97.9 F (36.6 C)    SpO2: 97% 99% 100%   Eyes: lids and conjunctivae normal Neck: normal, supple Respiratory: clear to auscultation bilaterally. Normal respiratory effort. No accessory muscle use.  Cardiovascular: Regular rate and rhythm, no murmurs. Abdomen: no tenderness, no distention. Bowel sounds positive.  Musculoskeletal:  No edema. Skin: no rashes, lesions, ulcers.  Psychiatric: Flat affect  Labs on Admission: I have personally reviewed following labs and imaging studies  CBC: Recent Labs  Lab 01/06/21 1242  WBC 5.6  NEUTROABS 4.4  HGB 12.4  HCT 33.3*  MCV 91.0  PLT 209   Basic Metabolic Panel: Recent Labs  Lab 01/06/21 1242  NA 115*  K 3.2*  CL 80*  CO2 25  GLUCOSE 98  BUN 11  CREATININE 0.75  CALCIUM 8.8*   GFR: CrCl cannot be calculated (Unknown ideal weight.). Liver Function Tests: Recent Labs  Lab 01/06/21 1242  AST 41  ALT 28  ALKPHOS 34*  BILITOT 1.2  PROT 6.3*  ALBUMIN 4.1   No results for input(s): LIPASE, AMYLASE in the last 168 hours. No results for input(s): AMMONIA in the last 168 hours. Coagulation Profile: No results for input(s): INR, PROTIME in the last 168 hours.  Cardiac Enzymes: No results for input(s): CKTOTAL, CKMB, CKMBINDEX, TROPONINI in the last 168 hours. BNP (last 3 results) No results for input(s): PROBNP in the last 8760 hours. HbA1C: No results for input(s): HGBA1C in the last 72 hours. CBG: No results for input(s): GLUCAP in the last 168 hours. Lipid Profile: No results for input(s): CHOL, HDL, LDLCALC, TRIG, CHOLHDL, LDLDIRECT in the last 72 hours. Thyroid Function Tests: Recent Labs    01/06/21 1242  TSH 2.774   Anemia Panel: No results for input(s): VITAMINB12, FOLATE, FERRITIN, TIBC, IRON,  RETICCTPCT in the last 72 hours. Urine analysis:    Component Value Date/Time   COLORURINE STRAW (A) 01/06/2021 1404   APPEARANCEUR CLEAR 01/06/2021 1404   LABSPEC 1.004 (L) 01/06/2021 1404   PHURINE 7.0 01/06/2021 1404   GLUCOSEU NEGATIVE 01/06/2021 1404   HGBUR NEGATIVE 01/06/2021 1404   BILIRUBINUR NEGATIVE 01/06/2021 1404   KETONESUR 5 (A) 01/06/2021 1404   PROTEINUR NEGATIVE 01/06/2021 1404   NITRITE NEGATIVE 01/06/2021 1404   LEUKOCYTESUR NEGATIVE 01/06/2021 1404    Radiological Exams on Admission: DG Chest Port 1 View  Result Date: 01/06/2021 CLINICAL DATA:  Weakness. EXAM: PORTABLE CHEST 1 VIEW COMPARISON:  None. FINDINGS: The heart size and mediastinal contours are within normal limits. Both lungs are clear. The visualized skeletal structures are unremarkable. IMPRESSION: No active disease. Aortic Atherosclerosis (ICD10-I70.0). Electronically Signed   By: Lupita Raider M.D.   On: 01/06/2021 13:45    Assessment/Plan Active Problems:   Acute metabolic encephalopathy    Acute encephalopathy-likely metabolic -Secondary to hyponatremia -Check head CT for full evaluation -TSH 2.774  Acute hyponatremia -Likely secondary to poor oral intake along with HCTZ use at home -Hold further HCTZ -Regular diet -IV normal saline infusion -Monitor sodium every 6 hours for approximately 8 mEq improvement each 24 hours -TSH 2.774 -Plan to check serum and urine osmolarity and urine sodium  Mild hypokalemia -Replete and reevaluate in a.m.  History of hypertension -Currently with high blood pressure readings -Restart home lisinopril, but avoid HCTZ -Labetalol as needed for significant elevations  History of COPD -DuoNebs as needed -No acute bronchospasms currently  Hypothyroidism -Continue Synthroid  Possible dementia versus MCI -Lives by herself and is fully independent   DVT prophylaxis: Lovenox Code Status: DNR Family Communication: Daughter at bedside Disposition  Plan:Admit for treatment of hyponatremia/encephalopathy Consults called:None Admission status: Inpatient, MedSurg  Diane Hanel D Sherryll Burger DO [facility]  If 7PM-7AM, please contact night-coverage [email or web address]  01/06/2021, 2:32 PM

## 2021-01-06 NOTE — ED Notes (Signed)
Date and time results received: 01/06/21 1401   Test: NA Critical Value: 115  Name of Provider Notified: [place]   Orders Received? Or Actions Taken?: see N.O.

## 2021-01-06 NOTE — Progress Notes (Signed)
Date and time results received: 01/06/21 1845 (use smartphrase ".now" to insert current time)  Test: sodium  Critical Value: 118  Name of Provider Notified: Lona Millard DO  Orders Received? Or Actions Taken?: No orders received

## 2021-01-06 NOTE — ED Notes (Signed)
Patient denies pain and is resting comfortably.  

## 2021-01-06 NOTE — ED Triage Notes (Signed)
weakness and nausea for 2 weeks

## 2021-01-07 LAB — SODIUM
Sodium: 121 mmol/L — ABNORMAL LOW (ref 135–145)
Sodium: 121 mmol/L — ABNORMAL LOW (ref 135–145)
Sodium: 123 mmol/L — ABNORMAL LOW (ref 135–145)
Sodium: 123 mmol/L — ABNORMAL LOW (ref 135–145)

## 2021-01-07 LAB — CBC
HCT: 30.2 % — ABNORMAL LOW (ref 36.0–46.0)
Hemoglobin: 11.1 g/dL — ABNORMAL LOW (ref 12.0–15.0)
MCH: 34 pg (ref 26.0–34.0)
MCHC: 36.8 g/dL — ABNORMAL HIGH (ref 30.0–36.0)
MCV: 92.6 fL (ref 80.0–100.0)
Platelets: 204 10*3/uL (ref 150–400)
RBC: 3.26 MIL/uL — ABNORMAL LOW (ref 3.87–5.11)
RDW: 11.7 % (ref 11.5–15.5)
WBC: 6 10*3/uL (ref 4.0–10.5)
nRBC: 0 % (ref 0.0–0.2)

## 2021-01-07 LAB — BASIC METABOLIC PANEL
Anion gap: 8 (ref 5–15)
BUN: 10 mg/dL (ref 8–23)
CO2: 19 mmol/L — ABNORMAL LOW (ref 22–32)
Calcium: 8.2 mg/dL — ABNORMAL LOW (ref 8.9–10.3)
Chloride: 94 mmol/L — ABNORMAL LOW (ref 98–111)
Creatinine, Ser: 0.58 mg/dL (ref 0.44–1.00)
GFR, Estimated: >60 mL/min (ref >60)
Glucose, Bld: 80 mg/dL (ref 70–99)
Potassium: 3.7 mmol/L (ref 3.5–5.1)
Sodium: 121 mmol/L — ABNORMAL LOW (ref 135–145)

## 2021-01-07 LAB — MAGNESIUM: Magnesium: 1.6 mg/dL — ABNORMAL LOW (ref 1.7–2.4)

## 2021-01-07 LAB — SARS CORONAVIRUS 2 (TAT 6-24 HRS): SARS Coronavirus 2: NEGATIVE

## 2021-01-07 MED ORDER — ASPIRIN EC 81 MG PO TBEC
81.0000 mg | DELAYED_RELEASE_TABLET | Freq: Every day | ORAL | Status: DC
  Administered 2021-01-07 – 2021-01-09 (×3): 81 mg via ORAL
  Filled 2021-01-07 (×3): qty 1

## 2021-01-07 MED ORDER — LOPERAMIDE HCL 2 MG PO CAPS
2.0000 mg | ORAL_CAPSULE | ORAL | Status: DC | PRN
  Administered 2021-01-07 – 2021-01-08 (×3): 2 mg via ORAL
  Filled 2021-01-07 (×4): qty 1

## 2021-01-07 MED ORDER — HALOPERIDOL LACTATE 5 MG/ML IJ SOLN
1.0000 mg | Freq: Four times a day (QID) | INTRAMUSCULAR | Status: DC | PRN
  Administered 2021-01-07 – 2021-01-09 (×2): 1 mg via INTRAVENOUS
  Filled 2021-01-07 (×2): qty 1

## 2021-01-07 MED ORDER — VITAMIN D 25 MCG (1000 UNIT) PO TABS
2000.0000 [IU] | ORAL_TABLET | Freq: Every day | ORAL | Status: DC
  Administered 2021-01-07 – 2021-01-09 (×3): 2000 [IU] via ORAL
  Filled 2021-01-07 (×3): qty 2

## 2021-01-07 MED ORDER — FLUTICASONE-UMECLIDIN-VILANT 100-62.5-25 MCG/INH IN AEPB: 1.0000 | INHALATION_SPRAY | Freq: Every day | RESPIRATORY_TRACT | Status: DC

## 2021-01-07 MED ORDER — UMECLIDINIUM BROMIDE 62.5 MCG/INH IN AEPB
1.0000 | INHALATION_SPRAY | Freq: Every day | RESPIRATORY_TRACT | Status: DC
  Administered 2021-01-07 – 2021-01-09 (×3): 1 via RESPIRATORY_TRACT
  Filled 2021-01-07: qty 7

## 2021-01-07 MED ORDER — FLUTICASONE FUROATE-VILANTEROL 100-25 MCG/INH IN AEPB
1.0000 | INHALATION_SPRAY | Freq: Every day | RESPIRATORY_TRACT | Status: DC
  Administered 2021-01-07 – 2021-01-09 (×3): 1 via RESPIRATORY_TRACT
  Filled 2021-01-07: qty 28

## 2021-01-07 MED ORDER — LISINOPRIL 10 MG PO TABS
20.0000 mg | ORAL_TABLET | Freq: Every day | ORAL | Status: DC
  Administered 2021-01-07 – 2021-01-09 (×3): 20 mg via ORAL
  Filled 2021-01-07 (×3): qty 2

## 2021-01-07 MED ORDER — MAGNESIUM SULFATE 2 GM/50ML IV SOLN
2.0000 g | Freq: Once | INTRAVENOUS | Status: AC
  Administered 2021-01-07: 2 g via INTRAVENOUS
  Filled 2021-01-07: qty 50

## 2021-01-07 MED ORDER — PRAVASTATIN SODIUM 10 MG PO TABS
20.0000 mg | ORAL_TABLET | Freq: Every evening | ORAL | Status: DC
  Administered 2021-01-07 – 2021-01-08 (×2): 20 mg via ORAL
  Filled 2021-01-07 (×3): qty 2

## 2021-01-07 MED ORDER — LEVOTHYROXINE SODIUM 75 MCG PO TABS
75.0000 ug | ORAL_TABLET | Freq: Every day | ORAL | Status: DC
  Administered 2021-01-08 – 2021-01-09 (×2): 75 ug via ORAL
  Filled 2021-01-07 (×2): qty 1

## 2021-01-07 MED ORDER — HALOPERIDOL LACTATE 5 MG/ML IJ SOLN
2.0000 mg | Freq: Four times a day (QID) | INTRAMUSCULAR | Status: DC | PRN
  Filled 2021-01-07: qty 1

## 2021-01-07 MED ORDER — ADULT MULTIVITAMIN W/MINERALS CH
ORAL_TABLET | Freq: Every day | ORAL | Status: DC
  Administered 2021-01-07 – 2021-01-09 (×3): 1 via ORAL
  Filled 2021-01-07 (×3): qty 1

## 2021-01-07 NOTE — Progress Notes (Signed)
PROGRESS NOTE    IRIDIAN READER  YSA:[id number] DOB: 1925-12-20 DOA: 01/06/2021 PCP: Estanislado Pandy, MD   Brief Narrative:  Theresa Bush is a 87 y.o. female with medical history significant for COPD, hypertension, dyslipidemia, hypothyroidism, and likely dementia who presented to the ED with worsening confusion, weakness, nausea that has not been persisting for approximately 2 weeks according to daughter at bedside.  She has had poor appetite and little oral intake recently.  Patient admitted with acute metabolic encephalopathy in the setting of acute hyponatremia.  Noted to have HCTZ use at home  Assessment & Plan:   Active Problems:   Acute metabolic encephalopathy   Acute encephalopathy-likely metabolic, improving -Secondary to hyponatremia -Head CT with signs of atrophy but no acute CVA -TSH 2.774  Acute hyponatremia-improving -Likely secondary to poor oral intake along with HCTZ use at home -Hold further HCTZ -Regular diet -IV normal saline infusion to continue -Monitor sodium every 6 hours for approximately 8 mEq improvement each 24 hours -TSH 2.774 -Serum osmolarity noted to be low -Urine osmolarity and sodium level pending  Mild hypomagnesemia -Replete and reevaluate in a.m.  History of hypertension -Currently with high blood pressure readings -Restart home lisinopril 20 mg, but hold HCTZ -Labetalol as needed for significant elevations  History of COPD -DuoNebs as needed -No acute bronchospasms currently  Hypothyroidism -Continue Synthroid  Possible dementia versus MCI -Lives by herself and is fully independent   DVT prophylaxis:Lovenox Code Status: DNR Family Communication: Spoke with son-in-law at bedside 3/25 Disposition Plan:  Status is: Inpatient  Remains inpatient appropriate because:Persistent severe electrolyte disturbances, Altered mental status, IV treatments appropriate due to intensity of illness or inability to take PO  and Inpatient level of care appropriate due to severity of illness   Dispo: The patient is from: Home              Anticipated d/c is to: SNF              Patient currently is not medically stable to d/c.   Difficult to place patient No   Consultants:   None  Procedures:   See below  Antimicrobials:   None   Subjective: Patient seen and evaluated today with increased alertness and some agitation overnight.  She still has poor appetite and does not want to eat.  Objective: Vitals:   01/06/21 1718 01/06/21 1721 01/06/21 2221 01/07/21 0541  BP: (!) 166/69 (!) 167/74 (!) 145/71 115/61  Pulse: 74 79 66 74  Resp: 15 20 20 18   Temp: 97.9 F (36.6 C) 98 F (36.7 C) 98.2 F (36.8 C) 98.1 F (36.7 C)  TempSrc: Oral Oral  Oral  SpO2:  100% 97% 97%  Weight: 46.9 kg     Height: 5' (1.524 m)       Intake/Output Summary (Last 24 hours) at 01/07/2021 0953 Last data filed at 01/07/2021 0341 Gross per 24 hour  Intake 1463.8 ml  Output --  Net 1463.8 ml   Filed Weights   01/06/21 1718  Weight: 46.9 kg    Examination:  General exam: Appears minimally agitated Respiratory system: Clear to auscultation. Respiratory effort normal. Cardiovascular system: S1 & S2 heard, RRR.  Gastrointestinal system: Abdomen is soft Central nervous system: Alert and awake Extremities: No edema Skin: No significant lesions noted Psychiatry: Flat affect.    Data Reviewed: I have personally reviewed following labs and imaging studies  CBC: Recent Labs  Lab 01/06/21 1242 01/07/21 0125  WBC 5.6 6.0  NEUTROABS 4.4  --   HGB 12.4 11.1*  HCT 33.3* 30.2*  MCV 91.0 92.6  PLT 209 204   Basic Metabolic Panel: Recent Labs  Lab 01/06/21 1242 01/06/21 1815 01/06/21 2355 01/07/21 0125 01/07/21 0413  NA 115* 118* 121* 121* 123*  K 3.2*  --   --  3.7  --   CL 80*  --   --  94*  --   CO2 25  --   --  19*  --   GLUCOSE 98  --   --  80  --   BUN 11  --   --  10  --   CREATININE 0.75  --    --  0.58  --   CALCIUM 8.8*  --   --  8.2*  --   MG  --   --   --  1.6*  --    GFR: Estimated Creatinine Clearance: 32.2 mL/min (by C-G formula based on SCr of 0.58 mg/dL). Liver Function Tests: Recent Labs  Lab 01/06/21 1242  AST 41  ALT 28  ALKPHOS 34*  BILITOT 1.2  PROT 6.3*  ALBUMIN 4.1   No results for input(s): LIPASE, AMYLASE in the last 168 hours. No results for input(s): AMMONIA in the last 168 hours. Coagulation Profile: No results for input(s): INR, PROTIME in the last 168 hours. Cardiac Enzymes: No results for input(s): CKTOTAL, CKMB, CKMBINDEX, TROPONINI in the last 168 hours. BNP (last 3 results) No results for input(s): PROBNP in the last 8760 hours. HbA1C: No results for input(s): HGBA1C in the last 72 hours. CBG: No results for input(s): GLUCAP in the last 168 hours. Lipid Profile: No results for input(s): CHOL, HDL, LDLCALC, TRIG, CHOLHDL, LDLDIRECT in the last 72 hours. Thyroid Function Tests: Recent Labs    01/06/21 1242  TSH 2.774   Anemia Panel: No results for input(s): VITAMINB12, FOLATE, FERRITIN, TIBC, IRON, RETICCTPCT in the last 72 hours. Sepsis Labs: No results for input(s): PROCALCITON, LATICACIDVEN in the last 168 hours.  Recent Results (from the past 240 hour(s))  SARS CORONAVIRUS 2 (TAT 6-24 HRS) Nasopharyngeal Nasopharyngeal Swab     Status: None   Collection Time: 01/06/21  3:53 PM   Specimen: Nasopharyngeal Swab  Result Value Ref Range Status   SARS Coronavirus 2 NEGATIVE NEGATIVE Final    Comment: (NOTE) SARS-CoV-2 target nucleic acids are NOT DETECTED.  The SARS-CoV-2 RNA is generally detectable in upper and lower respiratory specimens during the acute phase of infection. Negative results do not preclude SARS-CoV-2 infection, do not rule out co-infections with other pathogens, and should not be used as the sole basis for treatment or other patient management decisions. Negative results must be combined with clinical  observations, patient history, and epidemiological information. The expected result is Negative.  Fact Sheet for Patients: HairSlick.no  Fact Sheet for Healthcare Providers: quierodirigir.com  This test is not yet approved or cleared by the [place] FDA and  has been authorized for detection and/or diagnosis of SARS-CoV-2 by FDA under an Emergency Use Authorization (EUA). This EUA will remain  in effect (meaning this test can be used) for the duration of the COVID-19 declaration under Se ction 564(b)(1) of the Act, 21 U.S.C. section 360bbb-3(b)(1), unless the authorization is terminated or revoked sooner.  Performed at [facility], [address] [place], [place] [address]          Radiology Studies: CT HEAD WO CONTRAST  Result Date: 01/06/2021 CLINICAL DATA:  Altered mental status. EXAM: CT HEAD WITHOUT CONTRAST TECHNIQUE: Contiguous axial images were obtained from the base of the skull through the vertex without intravenous contrast. COMPARISON:  None. FINDINGS: Brain: Mild diffuse cortical atrophy is noted. Mild chronic ischemic white matter disease is noted. No mass effect or midline shift is noted. Ventricular size is within normal limits. There is no evidence of mass lesion, hemorrhage or acute infarction. Vascular: No hyperdense vessel or unexpected calcification. Skull: Normal. Negative for fracture or focal lesion. Sinuses/Orbits: No acute finding. Other: None. IMPRESSION: Mild diffuse cortical atrophy. Mild chronic ischemic white matter disease. No acute intracranial abnormality seen. Electronically Signed   By: Lupita Raider M.D.   On: 01/06/2021 16:13   DG Chest Port 1 View  Result Date: 01/06/2021 CLINICAL DATA:  Weakness. EXAM: PORTABLE CHEST 1 VIEW COMPARISON:  None. FINDINGS: The heart size and mediastinal contours are within normal limits. Both lungs are clear. The visualized skeletal structures are  unremarkable. IMPRESSION: No active disease. Aortic Atherosclerosis (ICD10-I70.0). Electronically Signed   By: Lupita Raider M.D.   On: 01/06/2021 13:45        Scheduled Meds: . enoxaparin (LOVENOX) injection  40 mg Subcutaneous Q24H  . melatonin  6 mg Oral QHS   Continuous Infusions: . sodium chloride 75 mL/hr at 01/07/21 0538     LOS: 1 day    Time spent: 35 minutes    Abimelec Grochowski Hoover Brunette, DO [facility]  If 7PM-7AM, please contact night-coverage [email or web address] 01/07/2021, 9:53 AM

## 2021-01-07 NOTE — Care Management Important Message (Signed)
Important Message  Patient Details  Name: Theresa Bush MRN: [id number] Date of Birth: 07-12-1926   Medicare Important Message Given:  Yes     Corey Harold 01/07/2021, 12:26 PM

## 2021-01-08 LAB — CBC
HCT: 28.5 % — ABNORMAL LOW (ref 36.0–46.0)
Hemoglobin: 10.1 g/dL — ABNORMAL LOW (ref 12.0–15.0)
MCH: 34 pg (ref 26.0–34.0)
MCHC: 35.4 g/dL (ref 30.0–36.0)
MCV: 96 fL (ref 80.0–100.0)
Platelets: 171 10*3/uL (ref 150–400)
RBC: 2.97 MIL/uL — ABNORMAL LOW (ref 3.87–5.11)
RDW: 11.8 % (ref 11.5–15.5)
WBC: 7 10*3/uL (ref 4.0–10.5)
nRBC: 0 % (ref 0.0–0.2)

## 2021-01-08 LAB — BASIC METABOLIC PANEL
Anion gap: 8 (ref 5–15)
BUN: 6 mg/dL — ABNORMAL LOW (ref 8–23)
CO2: 19 mmol/L — ABNORMAL LOW (ref 22–32)
Calcium: 7.6 mg/dL — ABNORMAL LOW (ref 8.9–10.3)
Chloride: 99 mmol/L (ref 98–111)
Creatinine, Ser: 0.72 mg/dL (ref 0.44–1.00)
GFR, Estimated: >60 mL/min (ref >60)
Glucose, Bld: 80 mg/dL (ref 70–99)
Potassium: 3.1 mmol/L — ABNORMAL LOW (ref 3.5–5.1)
Sodium: 126 mmol/L — ABNORMAL LOW (ref 135–145)

## 2021-01-08 LAB — SODIUM: Sodium: 124 mmol/L — ABNORMAL LOW (ref 135–145)

## 2021-01-08 LAB — MAGNESIUM: Magnesium: 1.9 mg/dL (ref 1.7–2.4)

## 2021-01-08 MED ORDER — SODIUM CHLORIDE 1 G PO TABS
1.0000 g | ORAL_TABLET | Freq: Two times a day (BID) | ORAL | Status: DC
  Administered 2021-01-08 – 2021-01-09 (×3): 1 g via ORAL
  Filled 2021-01-08 (×5): qty 1

## 2021-01-08 MED ORDER — ENSURE ENLIVE PO LIQD
237.0000 mL | Freq: Two times a day (BID) | ORAL | Status: DC
  Administered 2021-01-08 – 2021-01-09 (×4): 237 mL via ORAL

## 2021-01-08 MED ORDER — POTASSIUM CHLORIDE CRYS ER 20 MEQ PO TBCR
40.0000 meq | EXTENDED_RELEASE_TABLET | Freq: Once | ORAL | Status: AC
  Administered 2021-01-08: 40 meq via ORAL
  Filled 2021-01-08: qty 2

## 2021-01-08 NOTE — Progress Notes (Signed)
PROGRESS NOTE    Theresa Bush  FVC:[id number] DOB: August 23, 1926 DOA: 01/06/2021 PCP: Estanislado Pandy, MD   Brief Narrative:   Theresa Burkemper Reynoldsis a 87 y.o.femalewith medical history significant forCOPD, hypertension, dyslipidemia, hypothyroidism, and likely dementia who presented to the ED with worsening confusion, weakness, nausea that has not been persisting for approximately 2 weeks according to daughter at bedside. She has had poor appetite and little oral intake recently.  Patient admitted with acute metabolic encephalopathy in the setting of acute hyponatremia.  Noted to have HCTZ use at home.  Assessment & Plan:   Active Problems:   Acute metabolic encephalopathy   Acute encephalopathy-likely metabolic, improving -Secondary to hyponatremia -Head CT with signs of atrophy but no acute CVA -TSH 2.774  Acute hyponatremia-improving -Likely secondary to poor oral intake along with HCTZ use at home -Hold further HCTZ -Regular diet -Start NaCl tabs -IV normal saline infusion to continue -Monitor sodium every 6 hours for approximately 8 mEq improvement each 24 hours -TSH 2.774 -Serum osmolarity noted to be low -Urine osmolarity and sodium level pending  Mild hypokalemia -Replete and reevaluate in a.m.  History of hypertension -Currently with high blood pressure readings -Restart home lisinopril 20 mg, but hold HCTZ -Labetalol as needed for significant elevations  History of COPD -DuoNebs as needed -No acute bronchospasms currently  Hypothyroidism -Continue Synthroid  Possible dementia versus MCI -Lives by herself and is fully independent   DVT prophylaxis:Lovenox Code Status: DNR Family Communication: Spoke with daughter and son at bedside 3/26 Disposition Plan:  Status is: Inpatient  Remains inpatient appropriate because:Persistent severe electrolyte disturbances, Altered mental status, IV treatments appropriate due to intensity of illness  or inability to take PO and Inpatient level of care appropriate due to severity of illness   Dispo: The patient is from: Home  Anticipated d/c is to: Home with hospice  Patient currently is not medically stable to d/c.              Difficult to place patient No   Consultants:   None  Procedures:   See below  Antimicrobials:   None  Subjective: Patient seen and evaluated today with no new acute complaints or concerns. No acute concerns or events noted overnight.  She appears to be more alert and wanting to eat this morning.  Objective: Vitals:   01/07/21 1957 01/07/21 2003 01/08/21 0449 01/08/21 0905  BP:  (!) 147/75 128/68   Pulse:  97 82   Resp:  16 18   Temp:  97.7 F (36.5 C) 98.3 F (36.8 C)   TempSrc:  Oral Oral   SpO2: 96% 97% 96% 93%  Weight:      Height:        Intake/Output Summary (Last 24 hours) at 01/08/2021 1035 Last data filed at 01/08/2021 0900 Gross per 24 hour  Intake 1110 ml  Output -  Net 1110 ml   Filed Weights   01/06/21 1718  Weight: 46.9 kg    Examination:  General exam: Appears calm and comfortable  Respiratory system: Clear to auscultation. Respiratory effort normal. Cardiovascular system: S1 & S2 heard, RRR.  Gastrointestinal system: Abdomen is soft Central nervous system: Alert and awake Extremities: No edema Skin: No significant lesions noted Psychiatry: Flat affect.    Data Reviewed: I have personally reviewed following labs and imaging studies  CBC: Recent Labs  Lab 01/06/21 1242 01/07/21 0125 01/08/21 0448  WBC 5.6 6.0 7.0  NEUTROABS 4.4  --   --  HGB 12.4 11.1* 10.1*  HCT 33.3* 30.2* 28.5*  MCV 91.0 92.6 96.0  PLT 209 204 171   Basic Metabolic Panel: Recent Labs  Lab 01/06/21 1242 01/06/21 1815 01/07/21 0125 01/07/21 0413 01/07/21 1153 01/07/21 1740 01/07/21 2348 01/08/21 0448  NA 115*   < > 121* 123* 121* 123* 124* 126*  K 3.2*  --  3.7  --   --   --   --  3.1*  CL  80*  --  94*  --   --   --   --  99  CO2 25  --  19*  --   --   --   --  19*  GLUCOSE 98  --  80  --   --   --   --  80  BUN 11  --  10  --   --   --   --  6*  CREATININE 0.75  --  0.58  --   --   --   --  0.72  CALCIUM 8.8*  --  8.2*  --   --   --   --  7.6*  MG  --   --  1.6*  --   --   --   --  1.9   < > = values in this interval not displayed.   GFR: Estimated Creatinine Clearance: 32.2 mL/min (by C-G formula based on SCr of 0.72 mg/dL). Liver Function Tests: Recent Labs  Lab 01/06/21 1242  AST 41  ALT 28  ALKPHOS 34*  BILITOT 1.2  PROT 6.3*  ALBUMIN 4.1   No results for input(s): LIPASE, AMYLASE in the last 168 hours. No results for input(s): AMMONIA in the last 168 hours. Coagulation Profile: No results for input(s): INR, PROTIME in the last 168 hours. Cardiac Enzymes: No results for input(s): CKTOTAL, CKMB, CKMBINDEX, TROPONINI in the last 168 hours. BNP (last 3 results) No results for input(s): PROBNP in the last 8760 hours. HbA1C: No results for input(s): HGBA1C in the last 72 hours. CBG: No results for input(s): GLUCAP in the last 168 hours. Lipid Profile: No results for input(s): CHOL, HDL, LDLCALC, TRIG, CHOLHDL, LDLDIRECT in the last 72 hours. Thyroid Function Tests: Recent Labs    01/06/21 1242  TSH 2.774   Anemia Panel: No results for input(s): VITAMINB12, FOLATE, FERRITIN, TIBC, IRON, RETICCTPCT in the last 72 hours. Sepsis Labs: No results for input(s): PROCALCITON, LATICACIDVEN in the last 168 hours.  Recent Results (from the past 240 hour(s))  SARS CORONAVIRUS 2 (TAT 6-24 HRS) Nasopharyngeal Nasopharyngeal Swab     Status: None   Collection Time: 01/06/21  3:53 PM   Specimen: Nasopharyngeal Swab  Result Value Ref Range Status   SARS Coronavirus 2 NEGATIVE NEGATIVE Final    Comment: (NOTE) SARS-CoV-2 target nucleic acids are NOT DETECTED.  The SARS-CoV-2 RNA is generally detectable in upper and lower respiratory specimens during the acute  phase of infection. Negative results do not preclude SARS-CoV-2 infection, do not rule out co-infections with other pathogens, and should not be used as the sole basis for treatment or other patient management decisions. Negative results must be combined with clinical observations, patient history, and epidemiological information. The expected result is Negative.  Fact Sheet for Patients: HairSlick.no  Fact Sheet for Healthcare Providers: quierodirigir.com  This test is not yet approved or cleared by the [place] FDA and  has been authorized for detection and/or diagnosis of SARS-CoV-2 by FDA under an Emergency Use  Authorization (EUA). This EUA will remain  in effect (meaning this test can be used) for the duration of the COVID-19 declaration under Se ction 564(b)(1) of the Act, 21 U.S.C. section 360bbb-3(b)(1), unless the authorization is terminated or revoked sooner.  Performed at [facility], [address], [place], [place] [address]          Radiology Studies: CT HEAD WO CONTRAST  Result Date: 01/06/2021 CLINICAL DATA:  Altered mental status. EXAM: CT HEAD WITHOUT CONTRAST TECHNIQUE: Contiguous axial images were obtained from the base of the skull through the vertex without intravenous contrast. COMPARISON:  None. FINDINGS: Brain: Mild diffuse cortical atrophy is noted. Mild chronic ischemic white matter disease is noted. No mass effect or midline shift is noted. Ventricular size is within normal limits. There is no evidence of mass lesion, hemorrhage or acute infarction. Vascular: No hyperdense vessel or unexpected calcification. Skull: Normal. Negative for fracture or focal lesion. Sinuses/Orbits: No acute finding. Other: None. IMPRESSION: Mild diffuse cortical atrophy. Mild chronic ischemic white matter disease. No acute intracranial abnormality seen. Electronically Signed   By: Lupita Raider M.D.   On:  01/06/2021 16:13   DG Chest Port 1 View  Result Date: 01/06/2021 CLINICAL DATA:  Weakness. EXAM: PORTABLE CHEST 1 VIEW COMPARISON:  None. FINDINGS: The heart size and mediastinal contours are within normal limits. Both lungs are clear. The visualized skeletal structures are unremarkable. IMPRESSION: No active disease. Aortic Atherosclerosis (ICD10-I70.0). Electronically Signed   By: Lupita Raider M.D.   On: 01/06/2021 13:45        Scheduled Meds: . aspirin EC  81 mg Oral Daily  . cholecalciferol  2,000 Units Oral Daily  . enoxaparin (LOVENOX) injection  40 mg Subcutaneous Q24H  . feeding supplement  237 mL Oral BID BM  . fluticasone furoate-vilanterol  1 puff Inhalation Daily   And  . umeclidinium bromide  1 puff Inhalation Daily  . levothyroxine  75 mcg Oral Q0600  . lisinopril  20 mg Oral Daily  . melatonin  6 mg Oral QHS  . multivitamin with minerals   Oral Daily  . pravastatin  20 mg Oral QPM  . sodium chloride  1 g Oral BID WC   Continuous Infusions: . sodium chloride 100 mL/hr at 01/08/21 0549     LOS: 2 days    Time spent: 35 minutes    Pratik Hoover Brunette, DO [facility]  If 7PM-7AM, please contact night-coverage [email or web address] 01/08/2021, 10:35 AM

## 2021-01-09 LAB — MAGNESIUM: Magnesium: 1.8 mg/dL (ref 1.7–2.4)

## 2021-01-09 LAB — BASIC METABOLIC PANEL
Anion gap: 5 (ref 5–15)
BUN: 6 mg/dL — ABNORMAL LOW (ref 8–23)
CO2: 20 mmol/L — ABNORMAL LOW (ref 22–32)
Calcium: 7.5 mg/dL — ABNORMAL LOW (ref 8.9–10.3)
Chloride: 106 mmol/L (ref 98–111)
Creatinine, Ser: 0.57 mg/dL (ref 0.44–1.00)
GFR, Estimated: >60 mL/min (ref >60)
Glucose, Bld: 88 mg/dL (ref 70–99)
Potassium: 3.8 mmol/L (ref 3.5–5.1)
Sodium: 131 mmol/L — ABNORMAL LOW (ref 135–145)

## 2021-01-09 MED ORDER — HALOPERIDOL 0.5 MG PO TABS: 0.5000 mg | ORAL_TABLET | Freq: Three times a day (TID) | ORAL | 0 refills | Status: DC | PRN

## 2021-01-09 MED ORDER — ENSURE ENLIVE PO LIQD: 237.0000 mL | Freq: Two times a day (BID) | ORAL | 12 refills | Status: DC

## 2021-01-09 MED ORDER — LISINOPRIL 20 MG PO TABS: 20.0000 mg | ORAL_TABLET | Freq: Every day | ORAL | 2 refills | Status: DC

## 2021-01-09 NOTE — TOC Transition Note (Signed)
Transition of Care [facility]) - CM/SW Discharge Note   Patient Details  Name: Theresa Bush MRN: [id number] Date of Birth: 01/10/1926  Transition of Care [facility]) CM/SW Contact:  Barry Brunner, LCSW Phone Number: 01/09/2021, 10:46 AM   Clinical Narrative:    CSW notified of patient's readiness for discharge, OP palliative and HHPT needs. CSW contacted patient's family to inquire about agreeableness to referrals for OP pallitive and HH. Patient and family agreeable. CSW referred patient to [facility] hospice for Palliative and [facility]. [facility] agreeable to provide services. Denyse Amass with [facility] agreeable to provide services. TOC signing off.    Final next level of care: Home w Home Health Services Barriers to Discharge: Barriers Resolved   Patient Goals and CMS Choice Patient states their goals for this hospitalization and ongoing recovery are:: Return home with [facility] and OP palliative CMS Medicare.gov Compare Post Acute Care list provided to:: Patient Choice offered to / list presented to : Patient  Discharge Placement                    Patient and family notified of of transfer: 01/09/21  Discharge Plan and Services                DME Arranged: N/A DME Agency: NA       HH Arranged: PT HH Agency: [facility] Date [facility] Agency Contacted: 01/09/21 Time HH Agency Contacted: 1045 Representative spoke with at [facility] Agency: Denyse Amass  Social Determinants of Health (SDOH) Interventions     Readmission Risk Interventions No flowsheet data found.

## 2021-01-09 NOTE — Discharge Summary (Signed)
Physician Discharge Summary  Theresa Bush BDZ:[id number] DOB: [birth year] DOA: 01/06/2021  PCP: Estanislado Pandy, MD  Admit date: 01/06/2021  Discharge date: 01/09/2021  Admitted From:Home  Disposition:  Home with home health PT and palliative services  Recommendations for Outpatient Follow-up:  1. Follow up with PCP in 1-2 weeks 2. Please obtain BMP in one week 3. Hold further use of hydrochlorothiazide and consider salt tablets as needed to maintain adequate sodium levels in the future 4. Haldol as needed for agitation/anxiety  Home Health: Yes with PT  Equipment/Devices: Has home walker  Discharge Condition:Stable  CODE STATUS: DNR  Diet recommendation: Heart Healthy  Brief/Interim Summary: Theresa P Reynoldsis a 87 y.o.femalewith medical history significant forCOPD, hypertension, dyslipidemia, hypothyroidism, and likely dementia who presented to the ED with worsening confusion, weakness, nausea that has not been persisting for approximately 2 weeks according to daughter at bedside. She has had poor appetite and little oral intake recently.Patient admitted with acute metabolic encephalopathy in the setting of acute hyponatremia in the setting of hydrochlorothiazide use at home.  Her hydrochlorothiazide had been discontinued and she was started on IV normal saline with improvements and hyponatremia noted.  She also required some additional salt tablets as her appetite was not adequate in the hospital setting.  Thyroid function tests were within normal limits and head CT demonstrated cerebral atrophy with no other acute findings.  She appears to be back to her usual baseline at day of discharge with serum sodium of 131.  She has been instructed to remain off of her hydrochlorothiazide at home and to follow-up with her PCP within the next 1 week to recheck blood pressure levels and also repeat labs.  Her blood pressures are now slightly elevated at approximately 160 systolic,  but this is acceptable given her age.  No other acute events noted throughout the course of this admission.  Discharge Diagnoses:  Active Problems:   Acute metabolic encephalopathy  Principal discharge diagnosis: Acute metabolic encephalopathy secondary to hyponatremia in the setting of HCTZ use.  Discharge Instructions  Discharge Instructions    Diet - low sodium heart healthy   Complete by: As directed    Increase activity slowly   Complete by: As directed      Allergies as of 01/09/2021   No Known Allergies     Medication List    STOP taking these medications   lisinopril-hydrochlorothiazide 20-12.5 MG tablet Commonly known as: ZESTORETIC     TAKE these medications   albuterol 108 (90 Base) MCG/ACT inhaler Commonly known as: VENTOLIN HFA Inhale 2 puffs into the lungs every 6 (six) hours as needed.   aspirin 81 MG EC tablet Take 81 mg by mouth daily.   CENTRUM ADULTS PO Take 1 capsule by mouth daily.   EQL Vitamin D3 50 MCG (2000 UT) Caps Generic drug: Cholecalciferol Take 1 capsule by mouth daily.   feeding supplement Liqd Take 237 mLs by mouth 2 (two) times daily between meals. Start taking on: January 10, 2021   haloperidol 0.5 MG tablet Commonly known as: HALDOL Take 1 tablet (0.5 mg total) by mouth every 8 (eight) hours as needed for agitation (anxiety).   levothyroxine 75 MCG tablet Commonly known as: SYNTHROID Take by mouth.   lisinopril 20 MG tablet Commonly known as: ZESTRIL Take 1 tablet (20 mg total) by mouth daily. Start taking on: January 10, 2021   pravastatin 20 MG tablet Commonly known as: PRAVACHOL Take 1 tablet by mouth every evening.  Trelegy Ellipta 100-62.5-25 MCG/INH Aepb Generic drug: Fluticasone-Umeclidin-Vilant Inhale 1 puff into the lungs daily.       Follow-up Information    [facility] Follow up.   Specialty: Home Health Services Why: HPPT Contact information: [address] [place]  [address] [phone]        [facility] Follow up.   Specialty: Hospice Why: Palliative  Contact information: [address] [place] [address] [phone]       Estanislado Pandy, MD. Schedule an appointment as soon as possible for a visit in 1 week(s).   Specialty: Family Medicine Contact information: [address] [place] [address] [phone]              No Known Allergies  Consultations:  None   Procedures/Studies: CT HEAD WO CONTRAST  Result Date: 01/06/2021 CLINICAL DATA:  Altered mental status. EXAM: CT HEAD WITHOUT CONTRAST TECHNIQUE: Contiguous axial images were obtained from the base of the skull through the vertex without intravenous contrast. COMPARISON:  None. FINDINGS: Brain: Mild diffuse cortical atrophy is noted. Mild chronic ischemic white matter disease is noted. No mass effect or midline shift is noted. Ventricular size is within normal limits. There is no evidence of mass lesion, hemorrhage or acute infarction. Vascular: No hyperdense vessel or unexpected calcification. Skull: Normal. Negative for fracture or focal lesion. Sinuses/Orbits: No acute finding. Other: None. IMPRESSION: Mild diffuse cortical atrophy. Mild chronic ischemic white matter disease. No acute intracranial abnormality seen. Electronically Signed   By: Lupita Raider M.D.   On: 01/06/2021 16:13   DG Chest Port 1 View  Result Date: 01/06/2021 CLINICAL DATA:  Weakness. EXAM: PORTABLE CHEST 1 VIEW COMPARISON:  None. FINDINGS: The heart size and mediastinal contours are within normal limits. Both lungs are clear. The visualized skeletal structures are unremarkable. IMPRESSION: No active disease. Aortic Atherosclerosis (ICD10-I70.0). Electronically Signed   By: Lupita Raider M.D.   On: 01/06/2021 13:45      Discharge Exam: Vitals:   01/09/21 0919 01/09/21 1152  BP: (!) 164/69   Pulse: 84   Resp:    Temp:    SpO2: 96% 95%   Vitals:    01/08/21 2046 01/09/21 0504 01/09/21 0919 01/09/21 1152  BP: (!) 142/59 134/65 (!) 164/69   Pulse: 75 67 84   Resp: 18 18    Temp: 98.3 F (36.8 C) 98.2 F (36.8 C)    TempSrc: Oral Oral    SpO2: 98% 95% 96% 95%  Weight:      Height:        General: Pt is alert, awake, not in acute distress Cardiovascular: RRR, S1/S2 +, no rubs, no gallops Respiratory: CTA bilaterally, no wheezing, no rhonchi Abdominal: Soft, NT, ND, bowel sounds + Extremities: no edema, no cyanosis    The results of significant diagnostics from this hospitalization (including imaging, microbiology, ancillary and laboratory) are listed below for reference.     Microbiology: Recent Results (from the past 240 hour(s))  SARS CORONAVIRUS 2 (TAT 6-24 HRS) Nasopharyngeal Nasopharyngeal Swab     Status: None   Collection Time: 01/06/21  3:53 PM   Specimen: Nasopharyngeal Swab  Result Value Ref Range Status   SARS Coronavirus 2 NEGATIVE NEGATIVE Final    Comment: (NOTE) SARS-CoV-2 target nucleic acids are NOT DETECTED.  The SARS-CoV-2 RNA is generally detectable in upper and lower respiratory specimens during the acute phase of infection. Negative results do not preclude  SARS-CoV-2 infection, do not rule out co-infections with other pathogens, and should not be used as the sole basis for treatment or other patient management decisions. Negative results must be combined with clinical observations, patient history, and epidemiological information. The expected result is Negative.  Fact Sheet for Patients: HairSlick.no  Fact Sheet for Healthcare Providers: quierodirigir.com  This test is not yet approved or cleared by the [place] FDA and  has been authorized for detection and/or diagnosis of SARS-CoV-2 by FDA under an Emergency Use Authorization (EUA). This EUA will remain  in effect (meaning this test can be used) for the duration of the COVID-19  declaration under Se ction 564(b)(1) of the Act, 21 U.S.C. section 360bbb-3(b)(1), unless the authorization is terminated or revoked sooner.  Performed at [facility], [address], [place], [place] [address]      Labs: BNP (last 3 results) No results for input(s): BNP in the last 8760 hours. Basic Metabolic Panel: Recent Labs  Lab 01/06/21 1242 01/06/21 1815 01/07/21 0125 01/07/21 0413 01/07/21 1153 01/07/21 1740 01/07/21 2348 01/08/21 0448 01/09/21 0549  NA 115*   < > 121*   < > 121* 123* 124* 126* 131*  K 3.2*  --  3.7  --   --   --   --  3.1* 3.8  CL 80*  --  94*  --   --   --   --  99 106  CO2 25  --  19*  --   --   --   --  19* 20*  GLUCOSE 98  --  80  --   --   --   --  80 88  BUN 11  --  10  --   --   --   --  6* 6*  CREATININE 0.75  --  0.58  --   --   --   --  0.72 0.57  CALCIUM 8.8*  --  8.2*  --   --   --   --  7.6* 7.5*  MG  --   --  1.6*  --   --   --   --  1.9 1.8   < > = values in this interval not displayed.   Liver Function Tests: Recent Labs  Lab 01/06/21 1242  AST 41  ALT 28  ALKPHOS 34*  BILITOT 1.2  PROT 6.3*  ALBUMIN 4.1   No results for input(s): LIPASE, AMYLASE in the last 168 hours. No results for input(s): AMMONIA in the last 168 hours. CBC: Recent Labs  Lab 01/06/21 1242 01/07/21 0125 01/08/21 0448  WBC 5.6 6.0 7.0  NEUTROABS 4.4  --   --   HGB 12.4 11.1* 10.1*  HCT 33.3* 30.2* 28.5*  MCV 91.0 92.6 96.0  PLT 209 204 171   Cardiac Enzymes: No results for input(s): CKTOTAL, CKMB, CKMBINDEX, TROPONINI in the last 168 hours. BNP: Invalid input(s): POCBNP CBG: No results for input(s): GLUCAP in the last 168 hours. D-Dimer No results for input(s): DDIMER in the last 72 hours. Hgb A1c No results for input(s): HGBA1C in the last 72 hours. Lipid Profile No results for input(s): CHOL, HDL, LDLCALC, TRIG, CHOLHDL, LDLDIRECT in the last 72 hours. Thyroid function studies No results for input(s): TSH, T4TOTAL, T3FREE,  THYROIDAB in the last 72 hours.  Invalid input(s): FREET3 Anemia work up No results for input(s): VITAMINB12, FOLATE, FERRITIN, TIBC, IRON, RETICCTPCT in the last 72 hours. Urinalysis    Component  Value Date/Time   COLORURINE STRAW (A) 01/06/2021 1404   APPEARANCEUR CLEAR 01/06/2021 1404   LABSPEC 1.004 (L) 01/06/2021 1404   PHURINE 7.0 01/06/2021 1404   GLUCOSEU NEGATIVE 01/06/2021 1404   HGBUR NEGATIVE 01/06/2021 1404   BILIRUBINUR NEGATIVE 01/06/2021 1404   KETONESUR 5 (A) 01/06/2021 1404   PROTEINUR NEGATIVE 01/06/2021 1404   NITRITE NEGATIVE 01/06/2021 1404   LEUKOCYTESUR NEGATIVE 01/06/2021 1404   Sepsis Labs Invalid input(s): PROCALCITONIN,  WBC,  LACTICIDVEN Microbiology Recent Results (from the past 240 hour(s))  SARS CORONAVIRUS 2 (TAT 6-24 HRS) Nasopharyngeal Nasopharyngeal Swab     Status: None   Collection Time: 01/06/21  3:53 PM   Specimen: Nasopharyngeal Swab  Result Value Ref Range Status   SARS Coronavirus 2 NEGATIVE NEGATIVE Final    Comment: (NOTE) SARS-CoV-2 target nucleic acids are NOT DETECTED.  The SARS-CoV-2 RNA is generally detectable in upper and lower respiratory specimens during the acute phase of infection. Negative results do not preclude SARS-CoV-2 infection, do not rule out co-infections with other pathogens, and should not be used as the sole basis for treatment or other patient management decisions. Negative results must be combined with clinical observations, patient history, and epidemiological information. The expected result is Negative.  Fact Sheet for Patients: HairSlick.nohttps://www.fda.gov/media/138098/download  Fact Sheet for Healthcare Providers: quierodirigir.comhttps://www.fda.gov/media/138095/download  This test is not yet approved or cleared by the [place] FDA and  has been authorized for detection and/or diagnosis of SARS-CoV-2 by FDA under an Emergency Use Authorization (EUA). This EUA will remain  in effect (meaning this test can be used)  for the duration of the COVID-19 declaration under Se ction 564(b)(1) of the Act, 21 U.S.C. section 360bbb-3(b)(1), unless the authorization is terminated or revoked sooner.  Performed at [facility], [address] [place], [place] [id number]      Time coordinating discharge: 35 minutes  SIGNED:   Erick BlinksPratik D Jinelle Butchko, DO [facility] 01/09/2021, 2:54 PM  If 7PM-7AM, please contact night-coverage [email or web address]

## 2021-01-11 DIAGNOSIS — I700 Atherosclerosis of aorta: Secondary | ICD-10-CM | POA: Diagnosis not present

## 2021-01-11 DIAGNOSIS — E871 Hypo-osmolality and hyponatremia: Secondary | ICD-10-CM | POA: Diagnosis not present

## 2021-01-11 DIAGNOSIS — I10 Essential (primary) hypertension: Secondary | ICD-10-CM | POA: Diagnosis not present

## 2021-01-11 DIAGNOSIS — G319 Degenerative disease of nervous system, unspecified: Secondary | ICD-10-CM | POA: Diagnosis not present

## 2021-01-11 DIAGNOSIS — F0280 Dementia in other diseases classified elsewhere without behavioral disturbance: Secondary | ICD-10-CM | POA: Diagnosis not present

## 2021-01-11 DIAGNOSIS — M159 Polyosteoarthritis, unspecified: Secondary | ICD-10-CM | POA: Diagnosis not present

## 2021-01-11 DIAGNOSIS — J449 Chronic obstructive pulmonary disease, unspecified: Secondary | ICD-10-CM | POA: Diagnosis not present

## 2021-01-11 DIAGNOSIS — E039 Hypothyroidism, unspecified: Secondary | ICD-10-CM | POA: Diagnosis not present

## 2021-01-11 DIAGNOSIS — G9341 Metabolic encephalopathy: Secondary | ICD-10-CM | POA: Diagnosis not present

## 2021-01-12 DIAGNOSIS — J449 Chronic obstructive pulmonary disease, unspecified: Secondary | ICD-10-CM | POA: Diagnosis not present

## 2021-01-12 DIAGNOSIS — R42 Dizziness and giddiness: Secondary | ICD-10-CM | POA: Diagnosis not present

## 2021-01-12 DIAGNOSIS — R110 Nausea: Secondary | ICD-10-CM | POA: Diagnosis not present

## 2021-01-12 DIAGNOSIS — Z515 Encounter for palliative care: Secondary | ICD-10-CM | POA: Diagnosis not present

## 2021-01-13 DIAGNOSIS — J449 Chronic obstructive pulmonary disease, unspecified: Secondary | ICD-10-CM | POA: Diagnosis not present

## 2021-01-13 DIAGNOSIS — I10 Essential (primary) hypertension: Secondary | ICD-10-CM | POA: Diagnosis not present

## 2021-01-13 DIAGNOSIS — I700 Atherosclerosis of aorta: Secondary | ICD-10-CM | POA: Diagnosis not present

## 2021-01-13 DIAGNOSIS — E871 Hypo-osmolality and hyponatremia: Secondary | ICD-10-CM | POA: Diagnosis not present

## 2021-01-13 DIAGNOSIS — G319 Degenerative disease of nervous system, unspecified: Secondary | ICD-10-CM | POA: Diagnosis not present

## 2021-01-13 DIAGNOSIS — G9341 Metabolic encephalopathy: Secondary | ICD-10-CM | POA: Diagnosis not present

## 2021-01-13 DIAGNOSIS — M159 Polyosteoarthritis, unspecified: Secondary | ICD-10-CM | POA: Diagnosis not present

## 2021-01-13 DIAGNOSIS — E039 Hypothyroidism, unspecified: Secondary | ICD-10-CM | POA: Diagnosis not present

## 2021-01-13 DIAGNOSIS — F0280 Dementia in other diseases classified elsewhere without behavioral disturbance: Secondary | ICD-10-CM | POA: Diagnosis not present

## 2021-01-17 DIAGNOSIS — G9341 Metabolic encephalopathy: Secondary | ICD-10-CM | POA: Diagnosis not present

## 2021-01-17 DIAGNOSIS — E039 Hypothyroidism, unspecified: Secondary | ICD-10-CM | POA: Diagnosis not present

## 2021-01-17 DIAGNOSIS — M159 Polyosteoarthritis, unspecified: Secondary | ICD-10-CM | POA: Diagnosis not present

## 2021-01-17 DIAGNOSIS — G319 Degenerative disease of nervous system, unspecified: Secondary | ICD-10-CM | POA: Diagnosis not present

## 2021-01-17 DIAGNOSIS — E871 Hypo-osmolality and hyponatremia: Secondary | ICD-10-CM | POA: Diagnosis not present

## 2021-01-17 DIAGNOSIS — F0280 Dementia in other diseases classified elsewhere without behavioral disturbance: Secondary | ICD-10-CM | POA: Diagnosis not present

## 2021-01-17 DIAGNOSIS — I700 Atherosclerosis of aorta: Secondary | ICD-10-CM | POA: Diagnosis not present

## 2021-01-17 DIAGNOSIS — J449 Chronic obstructive pulmonary disease, unspecified: Secondary | ICD-10-CM | POA: Diagnosis not present

## 2021-01-17 DIAGNOSIS — I10 Essential (primary) hypertension: Secondary | ICD-10-CM | POA: Diagnosis not present

## 2021-01-20 DIAGNOSIS — F0280 Dementia in other diseases classified elsewhere without behavioral disturbance: Secondary | ICD-10-CM | POA: Diagnosis not present

## 2021-01-20 DIAGNOSIS — J449 Chronic obstructive pulmonary disease, unspecified: Secondary | ICD-10-CM | POA: Diagnosis not present

## 2021-01-20 DIAGNOSIS — G319 Degenerative disease of nervous system, unspecified: Secondary | ICD-10-CM | POA: Diagnosis not present

## 2021-01-20 DIAGNOSIS — E039 Hypothyroidism, unspecified: Secondary | ICD-10-CM | POA: Diagnosis not present

## 2021-01-20 DIAGNOSIS — E871 Hypo-osmolality and hyponatremia: Secondary | ICD-10-CM | POA: Diagnosis not present

## 2021-01-20 DIAGNOSIS — I700 Atherosclerosis of aorta: Secondary | ICD-10-CM | POA: Diagnosis not present

## 2021-01-20 DIAGNOSIS — M159 Polyosteoarthritis, unspecified: Secondary | ICD-10-CM | POA: Diagnosis not present

## 2021-01-20 DIAGNOSIS — G9341 Metabolic encephalopathy: Secondary | ICD-10-CM | POA: Diagnosis not present

## 2021-01-20 DIAGNOSIS — I10 Essential (primary) hypertension: Secondary | ICD-10-CM | POA: Diagnosis not present

## 2021-01-24 DIAGNOSIS — I10 Essential (primary) hypertension: Secondary | ICD-10-CM | POA: Diagnosis not present

## 2021-01-24 DIAGNOSIS — R42 Dizziness and giddiness: Secondary | ICD-10-CM | POA: Diagnosis not present

## 2021-01-24 DIAGNOSIS — R413 Other amnesia: Secondary | ICD-10-CM | POA: Diagnosis not present

## 2021-01-24 DIAGNOSIS — Z681 Body mass index (BMI) 19 or less, adult: Secondary | ICD-10-CM | POA: Diagnosis not present

## 2021-01-24 DIAGNOSIS — E559 Vitamin D deficiency, unspecified: Secondary | ICD-10-CM | POA: Diagnosis not present

## 2021-01-24 DIAGNOSIS — E871 Hypo-osmolality and hyponatremia: Secondary | ICD-10-CM | POA: Diagnosis not present

## 2021-01-24 DIAGNOSIS — R5383 Other fatigue: Secondary | ICD-10-CM | POA: Diagnosis not present

## 2021-01-24 DIAGNOSIS — E039 Hypothyroidism, unspecified: Secondary | ICD-10-CM | POA: Diagnosis not present

## 2021-01-26 DIAGNOSIS — F0280 Dementia in other diseases classified elsewhere without behavioral disturbance: Secondary | ICD-10-CM | POA: Diagnosis not present

## 2021-01-26 DIAGNOSIS — Z515 Encounter for palliative care: Secondary | ICD-10-CM | POA: Diagnosis not present

## 2021-01-26 DIAGNOSIS — G9341 Metabolic encephalopathy: Secondary | ICD-10-CM | POA: Diagnosis not present

## 2021-01-26 DIAGNOSIS — M159 Polyosteoarthritis, unspecified: Secondary | ICD-10-CM | POA: Diagnosis not present

## 2021-01-26 DIAGNOSIS — E039 Hypothyroidism, unspecified: Secondary | ICD-10-CM | POA: Diagnosis not present

## 2021-01-26 DIAGNOSIS — G319 Degenerative disease of nervous system, unspecified: Secondary | ICD-10-CM | POA: Diagnosis not present

## 2021-01-26 DIAGNOSIS — I10 Essential (primary) hypertension: Secondary | ICD-10-CM | POA: Diagnosis not present

## 2021-01-26 DIAGNOSIS — E871 Hypo-osmolality and hyponatremia: Secondary | ICD-10-CM | POA: Diagnosis not present

## 2021-01-26 DIAGNOSIS — J449 Chronic obstructive pulmonary disease, unspecified: Secondary | ICD-10-CM | POA: Diagnosis not present

## 2021-01-26 DIAGNOSIS — I700 Atherosclerosis of aorta: Secondary | ICD-10-CM | POA: Diagnosis not present

## 2021-01-29 DIAGNOSIS — J449 Chronic obstructive pulmonary disease, unspecified: Secondary | ICD-10-CM | POA: Diagnosis not present

## 2021-01-29 DIAGNOSIS — E039 Hypothyroidism, unspecified: Secondary | ICD-10-CM | POA: Diagnosis not present

## 2021-01-29 DIAGNOSIS — E871 Hypo-osmolality and hyponatremia: Secondary | ICD-10-CM | POA: Diagnosis not present

## 2021-01-29 DIAGNOSIS — G319 Degenerative disease of nervous system, unspecified: Secondary | ICD-10-CM | POA: Diagnosis not present

## 2021-01-29 DIAGNOSIS — G9341 Metabolic encephalopathy: Secondary | ICD-10-CM | POA: Diagnosis not present

## 2021-01-29 DIAGNOSIS — I10 Essential (primary) hypertension: Secondary | ICD-10-CM | POA: Diagnosis not present

## 2021-01-29 DIAGNOSIS — I700 Atherosclerosis of aorta: Secondary | ICD-10-CM | POA: Diagnosis not present

## 2021-01-29 DIAGNOSIS — F0280 Dementia in other diseases classified elsewhere without behavioral disturbance: Secondary | ICD-10-CM | POA: Diagnosis not present

## 2021-01-29 DIAGNOSIS — M159 Polyosteoarthritis, unspecified: Secondary | ICD-10-CM | POA: Diagnosis not present

## 2021-01-31 DIAGNOSIS — J449 Chronic obstructive pulmonary disease, unspecified: Secondary | ICD-10-CM | POA: Diagnosis not present

## 2021-01-31 DIAGNOSIS — G319 Degenerative disease of nervous system, unspecified: Secondary | ICD-10-CM | POA: Diagnosis not present

## 2021-01-31 DIAGNOSIS — M159 Polyosteoarthritis, unspecified: Secondary | ICD-10-CM | POA: Diagnosis not present

## 2021-01-31 DIAGNOSIS — G9341 Metabolic encephalopathy: Secondary | ICD-10-CM | POA: Diagnosis not present

## 2021-01-31 DIAGNOSIS — E871 Hypo-osmolality and hyponatremia: Secondary | ICD-10-CM | POA: Diagnosis not present

## 2021-01-31 DIAGNOSIS — I10 Essential (primary) hypertension: Secondary | ICD-10-CM | POA: Diagnosis not present

## 2021-01-31 DIAGNOSIS — F0280 Dementia in other diseases classified elsewhere without behavioral disturbance: Secondary | ICD-10-CM | POA: Diagnosis not present

## 2021-01-31 DIAGNOSIS — E039 Hypothyroidism, unspecified: Secondary | ICD-10-CM | POA: Diagnosis not present

## 2021-01-31 DIAGNOSIS — I700 Atherosclerosis of aorta: Secondary | ICD-10-CM | POA: Diagnosis not present

## 2021-02-02 DIAGNOSIS — G319 Degenerative disease of nervous system, unspecified: Secondary | ICD-10-CM | POA: Diagnosis not present

## 2021-02-02 DIAGNOSIS — M159 Polyosteoarthritis, unspecified: Secondary | ICD-10-CM | POA: Diagnosis not present

## 2021-02-02 DIAGNOSIS — E039 Hypothyroidism, unspecified: Secondary | ICD-10-CM | POA: Diagnosis not present

## 2021-02-02 DIAGNOSIS — I700 Atherosclerosis of aorta: Secondary | ICD-10-CM | POA: Diagnosis not present

## 2021-02-02 DIAGNOSIS — F0280 Dementia in other diseases classified elsewhere without behavioral disturbance: Secondary | ICD-10-CM | POA: Diagnosis not present

## 2021-02-02 DIAGNOSIS — J449 Chronic obstructive pulmonary disease, unspecified: Secondary | ICD-10-CM | POA: Diagnosis not present

## 2021-02-02 DIAGNOSIS — G9341 Metabolic encephalopathy: Secondary | ICD-10-CM | POA: Diagnosis not present

## 2021-02-02 DIAGNOSIS — I10 Essential (primary) hypertension: Secondary | ICD-10-CM | POA: Diagnosis not present

## 2021-02-02 DIAGNOSIS — E871 Hypo-osmolality and hyponatremia: Secondary | ICD-10-CM | POA: Diagnosis not present

## 2021-02-07 ENCOUNTER — Other Ambulatory Visit: Payer: Self-pay

## 2021-02-07 ENCOUNTER — Encounter: Payer: Self-pay | Admitting: Internal Medicine

## 2021-02-07 ENCOUNTER — Ambulatory Visit (INDEPENDENT_AMBULATORY_CARE_PROVIDER_SITE_OTHER): Payer: Medicare HMO | Admitting: Internal Medicine

## 2021-02-07 VITALS — BP 174/82 | HR 83 | Resp 18 | Ht 61.0 in | Wt 106.1 lb

## 2021-02-07 DIAGNOSIS — Z7689 Persons encountering health services in other specified circumstances: Secondary | ICD-10-CM | POA: Diagnosis not present

## 2021-02-07 DIAGNOSIS — J449 Chronic obstructive pulmonary disease, unspecified: Secondary | ICD-10-CM | POA: Insufficient documentation

## 2021-02-07 DIAGNOSIS — D649 Anemia, unspecified: Secondary | ICD-10-CM | POA: Diagnosis not present

## 2021-02-07 DIAGNOSIS — R451 Restlessness and agitation: Secondary | ICD-10-CM | POA: Diagnosis not present

## 2021-02-07 DIAGNOSIS — I10 Essential (primary) hypertension: Secondary | ICD-10-CM

## 2021-02-07 DIAGNOSIS — E871 Hypo-osmolality and hyponatremia: Secondary | ICD-10-CM | POA: Diagnosis not present

## 2021-02-07 DIAGNOSIS — E039 Hypothyroidism, unspecified: Secondary | ICD-10-CM

## 2021-02-07 MED ORDER — QUETIAPINE FUMARATE 25 MG PO TABS
25.0000 mg | ORAL_TABLET | Freq: Every day | ORAL | 2 refills | Status: DC
Start: 2021-02-07 — End: 2021-04-25

## 2021-02-07 NOTE — Assessment & Plan Note (Signed)
BP Readings from Last 1 Encounters:  02/07/21 (!) 174/82   Uncontrolled with Lisinopril 10 mg QD Increased dose back to 20 mg QD Counseled for compliance with the medications Advised DASH diet and moderate exercise/walking as tolerated

## 2021-02-07 NOTE — Progress Notes (Signed)
New Patient Office Visit  Subjective:  Patient ID: Theresa Bush, female    DOB: 11-Sep-1926  Age: 87 y.o. MRN: [id number]  CC:  Chief Complaint  Patient presents with  . New Patient (Initial Visit)    New patient was being seen by dr sasser at [facility] was in the hospital 3 weeks ago for low sodium     HPI Theresa Bush is a 87 year old female with PMH of HTN, COPD, hypothyroidism and HLD who presents for establishing care.  Her daughter is present during the visit.  She was recently hospitalized for symptomatic hyponatremia leading to metabolic encephalopathy.  Of note, she was on hydrochlorothiazide for hypertension, which was thought to be the culprit in addition to poor p.o. intake.  Her sodium improved from 115-131 at the time of discharge.  Daughter states that she is at the baseline mental and physical function status.  She had CBC and BMP done at her previous PCP after being discharged from the hospital.  Daughter states that they were told that her sodium was improving at that time.  Her lisinopril dose was decreased to 10 mg once daily as her blood pressure was borderline low.  Her blood pressure was elevated on multiple measurements in the office today.  She denies any headache, dizziness, chest pain, dyspnea or palpitations. Daughter states that she has been agitated, which might be contributing to her elevated BP. Although she has been less agitated lately since being discharged. She was placed on Haldol for agitation from the hospitalization recently.  Her PO intake is stable now. She lives by herself, but her daughter takes care of her during the daytime and her sons stay at her home at nighttime. She uses cane for support. She recently completed PT for strength training.  Past Medical History:  Diagnosis Date  . Acute metabolic encephalopathy 01/06/2021  . COPD (chronic obstructive pulmonary disease) (HCC)   . Hypertension   . Thyroid disease      History reviewed. No pertinent surgical history.  History reviewed. No pertinent family history.  Social History   Socioeconomic History  . Marital status: Widowed    Spouse name: Not on file  . Number of children: Not on file  . Years of education: Not on file  . Highest education level: Not on file  Occupational History  . Not on file  Tobacco Use  . Smoking status: Former Games developer  . Smokeless tobacco: Never Used  Substance and Sexual Activity  . Alcohol use: Not Currently  . Drug use: Never  . Sexual activity: Not on file  Other Topics Concern  . Not on file  Social History Narrative  . Not on file   Social Determinants of Health   Financial Resource Strain: Not on file  Food Insecurity: Not on file  Transportation Needs: Not on file  Physical Activity: Not on file  Stress: Not on file  Social Connections: Not on file  Intimate Partner Violence: Not on file    ROS Review of Systems  Constitutional: Negative for chills and fever.  HENT: Negative for congestion, sinus pressure, sinus pain and sore throat.   Eyes: Negative for pain and discharge.  Respiratory: Negative for cough and shortness of breath.   Cardiovascular: Negative for chest pain and palpitations.  Gastrointestinal: Negative for abdominal pain, constipation, diarrhea, nausea and vomiting.  Endocrine: Negative for polydipsia and polyuria.  Genitourinary: Negative for dysuria and hematuria.  Musculoskeletal: Negative for neck pain and neck  stiffness.  Skin: Negative for rash.  Neurological: Negative for dizziness and weakness.  Psychiatric/Behavioral: Positive for agitation. Negative for behavioral problems, dysphoric mood and sleep disturbance.    Objective:   Today's Vitals: BP (!) 174/82 (BP Location: Left Arm, Cuff Size: Normal)   Pulse 83   Resp 18   Ht 5\' 1"  (1.549 m)   Wt 106 lb 1.9 oz (48.1 kg)   SpO2 94%   BMI 20.05 kg/m   Physical Exam Vitals reviewed.  Constitutional:       General: She is not in acute distress.    Appearance: She is not diaphoretic.  HENT:     Head: Normocephalic and atraumatic.     Nose: Nose normal.     Mouth/Throat:     Mouth: Mucous membranes are moist.  Eyes:     General: No scleral icterus.    Extraocular Movements: Extraocular movements intact.     Pupils: Pupils are equal, round, and reactive to light.  Cardiovascular:     Rate and Rhythm: Normal rate and regular rhythm.     Pulses: Normal pulses.     Heart sounds: Normal heart sounds. No murmur heard.   Pulmonary:     Breath sounds: Normal breath sounds. No wheezing or rales.  Abdominal:     Palpations: Abdomen is soft.     Tenderness: There is no abdominal tenderness.  Musculoskeletal:     Cervical back: Neck supple. No tenderness.     Right lower leg: No edema.     Left lower leg: No edema.  Skin:    General: Skin is warm.     Findings: No rash.  Neurological:     General: No focal deficit present.     Mental Status: She is alert and oriented to person, place, and time.     Sensory: No sensory deficit.     Motor: No weakness.  Psychiatric:        Mood and Affect: Mood normal.        Behavior: Behavior normal.     Assessment & Plan:   Problem List Items Addressed This Visit      Encounter to establish care - Primary   Care established Previous chart reviewed History and medications reviewed with the patient       Cardiovascular and Mediastinum   Essential hypertension    BP Readings from Last 1 Encounters:  02/07/21 (!) 174/82   Uncontrolled with Lisinopril 10 mg QD Increased dose back to 20 mg QD Counseled for compliance with the medications Advised DASH diet and moderate exercise/walking as tolerated        Respiratory   Chronic obstructive pulmonary disease (HCC)    Well-controlled with Trelegy and PRN Albuterol        Endocrine   Hypothyroidism    Lab Results  Component Value Date   TSH 2.774 01/06/2021   On Levothyroxine 75 mcg  QD        Other         Agitation    Appears to be related to progressive dementia/cognitive decline DC Haldol, started Seroquel PRN      Relevant Medications   QUEtiapine (SEROQUEL) 25 MG tablet   Hyponatremia    Hypotonic hyponatremia due to poor PO intake and HCTZ use, was discontinued Has better PO intake now Will obtain last BMP from previous PCP office Check BMP in the next visit      Relevant Orders   Basic Metabolic Panel (BMET)  Magnesium   Anemia    Last CBC reviewed No signs of active bleeding Will recheck CBC in the next visit      Relevant Orders   CBC with Differential/Platelet      Outpatient Encounter Medications as of 02/07/2021  Medication Sig  . albuterol (VENTOLIN HFA) 108 (90 Base) MCG/ACT inhaler Inhale 2 puffs into the lungs every 6 (six) hours as needed.  Marland Kitchen aspirin 81 MG EC tablet Take 81 mg by mouth daily.  . Cholecalciferol (EQL VITAMIN D3) 50 MCG (2000 UT) CAPS Take 1 capsule by mouth daily.  . feeding supplement (ENSURE ENLIVE / ENSURE PLUS) LIQD Take 237 mLs by mouth 2 (two) times daily between meals.  Marland Kitchen levothyroxine (SYNTHROID) 75 MCG tablet Take by mouth.  Marland Kitchen lisinopril (ZESTRIL) 20 MG tablet Take 1 tablet (20 mg total) by mouth daily.  . Multiple Vitamins-Minerals (CENTRUM ADULTS PO) Take 1 capsule by mouth daily.  . pravastatin (PRAVACHOL) 20 MG tablet Take 1 tablet by mouth every evening.  Marland Kitchen QUEtiapine (SEROQUEL) 25 MG tablet Take 1 tablet (25 mg total) by mouth at bedtime.  . TRELEGY ELLIPTA 100-62.5-25 MCG/INH AEPB Inhale 1 puff into the lungs daily.  . [DISCONTINUED] haloperidol (HALDOL) 0.5 MG tablet Take 1 tablet (0.5 mg total) by mouth every 8 (eight) hours as needed for agitation (anxiety).   No facility-administered encounter medications on file as of 02/07/2021.    Follow-up: Return in about 4 weeks (around 03/07/2021) for HTN and blood tests review.   Anabel Halon, MD

## 2021-02-07 NOTE — Assessment & Plan Note (Signed)
Appears to be related to progressive dementia/cognitive decline DC Haldol, started Seroquel PRN

## 2021-02-07 NOTE — Assessment & Plan Note (Signed)
Well-controlled with Trelegy and PRN Albuterol 

## 2021-02-07 NOTE — Patient Instructions (Signed)
Please start taking Lisinopril 20 mg once a day.  Please take Seroquel for agitation/insomnia.  Please take Ensure supplements for improving protein intake.  Avoid skipping any meals. Please ensure at least 1.5 liters of fluid intake.  Please get fasting blood tests done before the next visit.

## 2021-02-07 NOTE — Assessment & Plan Note (Signed)
Last CBC reviewed No signs of active bleeding Will recheck CBC in the next visit

## 2021-02-07 NOTE — Assessment & Plan Note (Signed)
Care established Previous chart reviewed History and medications reviewed with the patient 

## 2021-02-07 NOTE — Assessment & Plan Note (Signed)
Lab Results  Component Value Date   TSH 2.774 01/06/2021   On Levothyroxine 75 mcg QD

## 2021-02-07 NOTE — Assessment & Plan Note (Signed)
Hypotonic hyponatremia due to poor PO intake and HCTZ use, was discontinued Has better PO intake now Will obtain last BMP from previous PCP office Check BMP in the next visit

## 2021-02-09 DIAGNOSIS — G9341 Metabolic encephalopathy: Secondary | ICD-10-CM | POA: Diagnosis not present

## 2021-02-09 DIAGNOSIS — F0280 Dementia in other diseases classified elsewhere without behavioral disturbance: Secondary | ICD-10-CM | POA: Diagnosis not present

## 2021-02-09 DIAGNOSIS — E871 Hypo-osmolality and hyponatremia: Secondary | ICD-10-CM | POA: Diagnosis not present

## 2021-02-09 DIAGNOSIS — I700 Atherosclerosis of aorta: Secondary | ICD-10-CM | POA: Diagnosis not present

## 2021-02-09 DIAGNOSIS — G319 Degenerative disease of nervous system, unspecified: Secondary | ICD-10-CM | POA: Diagnosis not present

## 2021-02-09 DIAGNOSIS — I10 Essential (primary) hypertension: Secondary | ICD-10-CM | POA: Diagnosis not present

## 2021-02-09 DIAGNOSIS — J449 Chronic obstructive pulmonary disease, unspecified: Secondary | ICD-10-CM | POA: Diagnosis not present

## 2021-02-09 DIAGNOSIS — M159 Polyosteoarthritis, unspecified: Secondary | ICD-10-CM | POA: Diagnosis not present

## 2021-02-09 DIAGNOSIS — E039 Hypothyroidism, unspecified: Secondary | ICD-10-CM | POA: Diagnosis not present

## 2021-02-16 DIAGNOSIS — F0280 Dementia in other diseases classified elsewhere without behavioral disturbance: Secondary | ICD-10-CM | POA: Diagnosis not present

## 2021-02-16 DIAGNOSIS — I700 Atherosclerosis of aorta: Secondary | ICD-10-CM | POA: Diagnosis not present

## 2021-02-16 DIAGNOSIS — E039 Hypothyroidism, unspecified: Secondary | ICD-10-CM | POA: Diagnosis not present

## 2021-02-16 DIAGNOSIS — J449 Chronic obstructive pulmonary disease, unspecified: Secondary | ICD-10-CM | POA: Diagnosis not present

## 2021-02-16 DIAGNOSIS — M159 Polyosteoarthritis, unspecified: Secondary | ICD-10-CM | POA: Diagnosis not present

## 2021-02-16 DIAGNOSIS — G319 Degenerative disease of nervous system, unspecified: Secondary | ICD-10-CM | POA: Diagnosis not present

## 2021-02-16 DIAGNOSIS — E871 Hypo-osmolality and hyponatremia: Secondary | ICD-10-CM | POA: Diagnosis not present

## 2021-02-16 DIAGNOSIS — I10 Essential (primary) hypertension: Secondary | ICD-10-CM | POA: Diagnosis not present

## 2021-02-16 DIAGNOSIS — G9341 Metabolic encephalopathy: Secondary | ICD-10-CM | POA: Diagnosis not present

## 2021-02-22 ENCOUNTER — Telehealth: Payer: Self-pay

## 2021-02-22 DIAGNOSIS — M159 Polyosteoarthritis, unspecified: Secondary | ICD-10-CM | POA: Diagnosis not present

## 2021-02-22 DIAGNOSIS — I700 Atherosclerosis of aorta: Secondary | ICD-10-CM | POA: Diagnosis not present

## 2021-02-22 DIAGNOSIS — E039 Hypothyroidism, unspecified: Secondary | ICD-10-CM | POA: Diagnosis not present

## 2021-02-22 DIAGNOSIS — I10 Essential (primary) hypertension: Secondary | ICD-10-CM | POA: Diagnosis not present

## 2021-02-22 DIAGNOSIS — F0280 Dementia in other diseases classified elsewhere without behavioral disturbance: Secondary | ICD-10-CM | POA: Diagnosis not present

## 2021-02-22 DIAGNOSIS — J449 Chronic obstructive pulmonary disease, unspecified: Secondary | ICD-10-CM | POA: Diagnosis not present

## 2021-02-22 DIAGNOSIS — G9341 Metabolic encephalopathy: Secondary | ICD-10-CM | POA: Diagnosis not present

## 2021-02-22 DIAGNOSIS — E871 Hypo-osmolality and hyponatremia: Secondary | ICD-10-CM | POA: Diagnosis not present

## 2021-02-22 DIAGNOSIS — G319 Degenerative disease of nervous system, unspecified: Secondary | ICD-10-CM | POA: Diagnosis not present

## 2021-02-22 NOTE — Telephone Encounter (Signed)
Pt daughter advised to check bp twice 30 mins apart if over 160/90 please call the office back and we will move up appt with verbal understanding

## 2021-02-22 NOTE — Telephone Encounter (Signed)
Calling in to   182 over 66. Running a little high --  Therapy is doing Great,and pt is being released from therapy today

## 2021-02-22 NOTE — Telephone Encounter (Signed)
Please advise her to check twice 30 minutes apart. If persistently, more than 160/90, please schedule a visit sooner.

## 2021-02-23 DIAGNOSIS — Z515 Encounter for palliative care: Secondary | ICD-10-CM | POA: Diagnosis not present

## 2021-02-23 DIAGNOSIS — J449 Chronic obstructive pulmonary disease, unspecified: Secondary | ICD-10-CM | POA: Diagnosis not present

## 2021-03-01 DIAGNOSIS — E871 Hypo-osmolality and hyponatremia: Secondary | ICD-10-CM | POA: Diagnosis not present

## 2021-03-01 DIAGNOSIS — D649 Anemia, unspecified: Secondary | ICD-10-CM | POA: Diagnosis not present

## 2021-03-02 LAB — MAGNESIUM: Magnesium: 1.9 mg/dL (ref 1.6–2.3)

## 2021-03-02 LAB — CBC WITH DIFFERENTIAL/PLATELET
Basophils Absolute: 0.1 10*3/uL (ref 0.0–0.2)
Basos: 2 %
EOS (ABSOLUTE): 0.2 10*3/uL (ref 0.0–0.4)
Eos: 4 %
Hematocrit: 36.7 % (ref 34.0–46.6)
Hemoglobin: 12.4 g/dL (ref 11.1–15.9)
Immature Grans (Abs): 0 10*3/uL (ref 0.0–0.1)
Immature Granulocytes: 1 %
Lymphocytes Absolute: 0.9 10*3/uL (ref 0.7–3.1)
Lymphs: 20 %
MCH: 34.2 pg — ABNORMAL HIGH (ref 26.6–33.0)
MCHC: 33.8 g/dL (ref 31.5–35.7)
MCV: 101 fL — ABNORMAL HIGH (ref 79–97)
Monocytes Absolute: 0.5 10*3/uL (ref 0.1–0.9)
Monocytes: 11 %
Neutrophils Absolute: 2.7 10*3/uL (ref 1.4–7.0)
Neutrophils: 62 %
Platelets: 231 10*3/uL (ref 150–450)
RBC: 3.63 x10E6/uL — ABNORMAL LOW (ref 3.77–5.28)
RDW: 12.3 % (ref 11.7–15.4)
WBC: 4.3 10*3/uL (ref 3.4–10.8)

## 2021-03-02 LAB — BASIC METABOLIC PANEL
BUN/Creatinine Ratio: 16 (ref 12–28)
BUN: 15 mg/dL (ref 10–36)
CO2: 22 mmol/L (ref 20–29)
Calcium: 9.2 mg/dL (ref 8.7–10.3)
Chloride: 94 mmol/L — ABNORMAL LOW (ref 96–106)
Creatinine, Ser: 0.94 mg/dL (ref 0.57–1.00)
Glucose: 95 mg/dL (ref 65–99)
Potassium: 4.6 mmol/L (ref 3.5–5.2)
Sodium: 130 mmol/L — ABNORMAL LOW (ref 134–144)
eGFR: 57 mL/min/{1.73_m2} — ABNORMAL LOW (ref >59)

## 2021-03-07 ENCOUNTER — Ambulatory Visit: Payer: Medicare HMO | Admitting: Internal Medicine

## 2021-03-15 ENCOUNTER — Encounter: Payer: Self-pay | Admitting: Internal Medicine

## 2021-03-15 ENCOUNTER — Ambulatory Visit (INDEPENDENT_AMBULATORY_CARE_PROVIDER_SITE_OTHER): Payer: Medicare HMO | Admitting: Internal Medicine

## 2021-03-15 ENCOUNTER — Other Ambulatory Visit: Payer: Self-pay

## 2021-03-15 VITALS — BP 132/92 | HR 89 | Resp 16 | Ht 62.0 in | Wt 105.8 lb

## 2021-03-15 DIAGNOSIS — M25551 Pain in right hip: Secondary | ICD-10-CM

## 2021-03-15 DIAGNOSIS — I10 Essential (primary) hypertension: Secondary | ICD-10-CM

## 2021-03-15 MED ORDER — LISINOPRIL 20 MG PO TABS: 20.0000 mg | ORAL_TABLET | Freq: Every day | ORAL | 1 refills | Status: DC

## 2021-03-15 NOTE — Progress Notes (Signed)
Established Patient Office Visit  Subjective:  Patient ID: Theresa Bush, female    DOB: [birth year]  Age: 87 y.o. MRN: [id number]  CC:  Chief Complaint  Patient presents with  . Follow-up    4 weeks follow up bp has been up at home pt has been having right groin pain sharp pain quickly comes and goes away has stayed tender     HPI Theresa Bush is a 87 year old female with PMH of HTN, COPD, hypothyroidism and HLD who presents for follow up of her chronic medical conditions.  She has been doing well overall. BP is well-controlled today. Takes medications regularly. Patient denies headache, dizziness, chest pain, dyspnea or palpitations. Daughter states that her BP runs high at home sometimes, ~160s/80s. She is willing to bring the device to the next visit.  She has been having intermittent right pelvic pain, which lasts for few seconds. Denies any recent injury. She attributes it to muscle strain and states that she would contact if it persists longer.  Past Medical History:  Diagnosis Date  . Acute metabolic encephalopathy 9/92/4268  . COPD (chronic obstructive pulmonary disease) ([place])   . Hypertension   . Thyroid disease     History reviewed. No pertinent surgical history.  History reviewed. No pertinent family history.  Social History   Socioeconomic History  . Marital status: Widowed    Spouse name: Not on file  . Number of children: Not on file  . Years of education: Not on file  . Highest education level: Not on file  Occupational History  . Not on file  Tobacco Use  . Smoking status: Former Research scientist (life sciences)  . Smokeless tobacco: Never Used  Substance and Sexual Activity  . Alcohol use: Not Currently  . Drug use: Never  . Sexual activity: Not on file  Other Topics Concern  . Not on file  Social History Narrative  . Not on file   Social Determinants of Health   Financial Resource Strain: Not on file  Food Insecurity: Not on file  Transportation Needs:  Not on file  Physical Activity: Not on file  Stress: Not on file  Social Connections: Not on file  Intimate Partner Violence: Not on file    Outpatient Medications Prior to Visit  Medication Sig Dispense Refill  . albuterol (VENTOLIN HFA) 108 (90 Base) MCG/ACT inhaler Inhale 2 puffs into the lungs every 6 (six) hours as needed.    Marland Kitchen aspirin 81 MG EC tablet Take 81 mg by mouth daily.    . Cholecalciferol (EQL VITAMIN D3) 50 MCG (2000 UT) CAPS Take 1 capsule by mouth daily.    . feeding supplement (ENSURE ENLIVE / ENSURE PLUS) LIQD Take 237 mLs by mouth 2 (two) times daily between meals. 237 mL 12  . levothyroxine (SYNTHROID) 75 MCG tablet Take by mouth.    . Multiple Vitamins-Minerals (CENTRUM ADULTS PO) Take 1 capsule by mouth daily.    . pravastatin (PRAVACHOL) 20 MG tablet Take 1 tablet by mouth every evening.    Marland Kitchen QUEtiapine (SEROQUEL) 25 MG tablet Take 1 tablet (25 mg total) by mouth at bedtime. 30 tablet 2  . TRELEGY ELLIPTA 100-62.5-25 MCG/INH AEPB Inhale 1 puff into the lungs daily.    Marland Kitchen lisinopril (ZESTRIL) 20 MG tablet Take 1 tablet (20 mg total) by mouth daily. 30 tablet 2   No facility-administered medications prior to visit.    No Known Allergies  ROS Review of Systems  Constitutional: Negative for chills and  fever.  HENT: Negative for congestion, sinus pressure, sinus pain and sore throat.   Eyes: Negative for pain and discharge.  Respiratory: Negative for cough and shortness of breath.   Cardiovascular: Negative for chest pain and palpitations.  Gastrointestinal: Negative for abdominal pain, constipation, diarrhea, nausea and vomiting.  Endocrine: Negative for polydipsia and polyuria.  Genitourinary: Negative for dysuria and hematuria.  Musculoskeletal: Negative for neck pain and neck stiffness.  Skin: Negative for rash.  Neurological: Negative for dizziness and weakness.  Psychiatric/Behavioral: Negative for agitation, behavioral problems, dysphoric mood and sleep  disturbance.      Objective:    Physical Exam Vitals reviewed.  Constitutional:      General: She is not in acute distress.    Appearance: She is not diaphoretic.  HENT:     Head: Normocephalic and atraumatic.     Nose: Nose normal.     Mouth/Throat:     Mouth: Mucous membranes are moist.  Eyes:     General: No scleral icterus.    Extraocular Movements: Extraocular movements intact.     Pupils: Pupils are equal, round, and reactive to light.  Cardiovascular:     Rate and Rhythm: Normal rate and regular rhythm.     Pulses: Normal pulses.     Heart sounds: Normal heart sounds. No murmur heard.   Pulmonary:     Breath sounds: Normal breath sounds. No wheezing or rales.  Abdominal:     Palpations: Abdomen is soft.     Tenderness: There is no abdominal tenderness.  Musculoskeletal:     Cervical back: Neck supple. No tenderness.     Right lower leg: No edema.     Left lower leg: No edema.  Skin:    General: Skin is warm.     Findings: No rash.  Neurological:     General: No focal deficit present.     Mental Status: She is alert and oriented to person, place, and time.     Sensory: No sensory deficit.     Motor: No weakness.  Psychiatric:        Mood and Affect: Mood normal.        Behavior: Behavior normal.     BP (!) 132/92 (BP Location: Right Arm, Patient Position: Sitting, Cuff Size: Normal)   Pulse 89   Resp 16   Ht _0  (1.575 m)   Wt 105 lb 12.8 oz (48 kg)   SpO2 96%   BMI 19.35 kg/m  Wt Readings from Last 3 Encounters:  03/15/21 105 lb 12.8 oz (48 kg)  02/07/21 106 lb 1.9 oz (48.1 kg)  01/06/21 103 lb 6.3 oz (46.9 kg)     Health Maintenance Due  Topic Date Due  . TETANUS/TDAP  Never done  . Zoster Vaccines- Shingrix (1 of 2) Never done  . PNA vac Low Risk Adult (1 of 2 - PCV13) Never done    There are no preventive care reminders to display for this patient.  Lab Results  Component Value Date   TSH 2.774 01/06/2021   Lab Results   Component Value Date   WBC 4.3 03/01/2021   HGB 12.4 03/01/2021   HCT 36.7 03/01/2021   MCV 101 (H) 03/01/2021   PLT 231 03/01/2021   Lab Results  Component Value Date   NA 130 (L) 03/01/2021   K 4.6 03/01/2021   CO2 22 03/01/2021   GLUCOSE 95 03/01/2021   BUN 15 03/01/2021   CREATININE 0.94 03/01/2021  BILITOT 1.2 01/06/2021   ALKPHOS 34 (L) 01/06/2021   AST 41 01/06/2021   ALT 28 01/06/2021   PROT 6.3 (L) 01/06/2021   ALBUMIN 4.1 01/06/2021   CALCIUM 9.2 03/01/2021   ANIONGAP 5 01/09/2021   EGFR 57 (L) 03/01/2021   No results found for: CHOL No results found for: HDL No results found for: LDLCALC No results found for: TRIG No results found for: CHOLHDL No results found for: HGBA1C    Assessment & Plan:   Problem List Items Addressed This Visit      Cardiovascular and Mediastinum   Essential hypertension - Primary    BP Readings from Last 1 Encounters:  03/15/21 (!) 132/92   Well-controlled with Lisinopril 20 mg QD Counseled for compliance with the medications Advised DASH diet and moderate exercise/walking as tolerated      Relevant Medications   lisinopril (ZESTRIL) 20 MG tablet    Other Visit Diagnoses    Pain in joint involving right pelvic region and thigh     She attributes it to muscle strain If persistent, will check CT chest to r/o renal stone      Meds ordered this encounter  Medications  . lisinopril (ZESTRIL) 20 MG tablet    Sig: Take 1 tablet (20 mg total) by mouth daily.    Dispense:  90 tablet    Refill:  1    Follow-up: Return in about 3 months (around 06/15/2021) for Annual physical.    Lindell Spar, MD

## 2021-03-15 NOTE — Patient Instructions (Signed)
Please continue to take medications as prescribed.  Please continue to take at least 64 ounces of fluid in a day.  Please apply heating pad in the hip area to help with pain/swelling. If persistent pain, please contact us.

## 2021-03-15 NOTE — Assessment & Plan Note (Signed)
BP Readings from Last 1 Encounters:  03/15/21 (!) 132/92   Well-controlled with Lisinopril 20 mg QD Counseled for compliance with the medications Advised DASH diet and moderate exercise/walking as tolerated

## 2021-03-25 DIAGNOSIS — Z23 Encounter for immunization: Secondary | ICD-10-CM | POA: Diagnosis not present

## 2021-04-06 DIAGNOSIS — J449 Chronic obstructive pulmonary disease, unspecified: Secondary | ICD-10-CM | POA: Diagnosis not present

## 2021-04-06 DIAGNOSIS — R1030 Lower abdominal pain, unspecified: Secondary | ICD-10-CM | POA: Diagnosis not present

## 2021-04-06 DIAGNOSIS — Z515 Encounter for palliative care: Secondary | ICD-10-CM | POA: Diagnosis not present

## 2021-04-25 ENCOUNTER — Other Ambulatory Visit: Payer: Self-pay | Admitting: Internal Medicine

## 2021-04-25 DIAGNOSIS — R451 Restlessness and agitation: Secondary | ICD-10-CM

## 2021-05-18 DIAGNOSIS — Z515 Encounter for palliative care: Secondary | ICD-10-CM | POA: Diagnosis not present

## 2021-05-18 DIAGNOSIS — J449 Chronic obstructive pulmonary disease, unspecified: Secondary | ICD-10-CM | POA: Diagnosis not present

## 2021-06-03 ENCOUNTER — Other Ambulatory Visit: Payer: Self-pay | Admitting: Internal Medicine

## 2021-06-15 ENCOUNTER — Encounter (INDEPENDENT_AMBULATORY_CARE_PROVIDER_SITE_OTHER): Payer: Self-pay

## 2021-06-15 ENCOUNTER — Ambulatory Visit (INDEPENDENT_AMBULATORY_CARE_PROVIDER_SITE_OTHER): Payer: Medicare HMO | Admitting: *Deleted

## 2021-06-15 ENCOUNTER — Other Ambulatory Visit: Payer: Self-pay

## 2021-06-15 ENCOUNTER — Encounter: Payer: Self-pay | Admitting: Internal Medicine

## 2021-06-15 ENCOUNTER — Ambulatory Visit (INDEPENDENT_AMBULATORY_CARE_PROVIDER_SITE_OTHER): Payer: Medicare HMO | Admitting: Internal Medicine

## 2021-06-15 VITALS — BP 202/98 | HR 81 | Resp 18 | Ht 64.0 in | Wt 104.0 lb

## 2021-06-15 DIAGNOSIS — R451 Restlessness and agitation: Secondary | ICD-10-CM

## 2021-06-15 DIAGNOSIS — Z0001 Encounter for general adult medical examination with abnormal findings: Secondary | ICD-10-CM | POA: Diagnosis not present

## 2021-06-15 DIAGNOSIS — Z23 Encounter for immunization: Secondary | ICD-10-CM | POA: Diagnosis not present

## 2021-06-15 DIAGNOSIS — E039 Hypothyroidism, unspecified: Secondary | ICD-10-CM | POA: Diagnosis not present

## 2021-06-15 DIAGNOSIS — I160 Hypertensive urgency: Secondary | ICD-10-CM

## 2021-06-15 DIAGNOSIS — Z0000 Encounter for general adult medical examination without abnormal findings: Secondary | ICD-10-CM

## 2021-06-15 DIAGNOSIS — D649 Anemia, unspecified: Secondary | ICD-10-CM | POA: Diagnosis not present

## 2021-06-15 MED ORDER — HYDRALAZINE HCL 25 MG PO TABS: 25.0000 mg | ORAL_TABLET | Freq: Three times a day (TID) | ORAL | 0 refills | Status: DC

## 2021-06-15 MED ORDER — AMLODIPINE BESYLATE 5 MG PO TABS
5.0000 mg | ORAL_TABLET | Freq: Every day | ORAL | 0 refills | Status: DC
Start: 2021-06-15 — End: 2021-06-23

## 2021-06-15 NOTE — Assessment & Plan Note (Signed)
Annual exam as documented. Counseling done  re healthy lifestyle involving commitment to 150 minutes exercise per week, heart healthy diet, and attaining healthy weight.The importance of adequate sleep also discussed. Changes in health habits are decided on by the patient with goals and time frames  set for achieving them. Immunization and cancer screening needs are specifically addressed at this visit. 

## 2021-06-15 NOTE — Patient Instructions (Signed)
Please start taking Amlodipine and Hydralazine as prescribed.  Continue taking Lisinopril.  Start taking Seroquel as prescribed for agitation and insomnia.  Please avoid skipping any meal. Make sure to stay hydrated by taking at least 50 ounces of fluid in a day.

## 2021-06-15 NOTE — Assessment & Plan Note (Signed)
Appears to be related to progressive dementia/cognitive decline Had started Seroquel PRN, but she has not been taking it Advised daughter to give it as it will improve her sleep and decrease agitation, which might be helpful for BP control as well.

## 2021-06-15 NOTE — Progress Notes (Signed)
Established Patient Office Visit  Subjective:  Patient ID: Theresa Bush, female    DOB: 1926/03/20  Age: 87 y.o. MRN: [id number]  CC:  Chief Complaint  Patient presents with   Annual Exam    HPI Theresa Bush is a 87 year old female with PMH of HTN, COPD, hypothyroidism and HLD who presents for annual physical. Her daughter is present during the visit.  HTN: She had significantly elevated BP in the office today on multiple measurements. She denies any headache, dizziness, chest pain, dyspnea or palpitations. Daughter states that her BP runs high around 160-170/80-90 at home.  Daughter and sons have been living with her to take care of her. They have not given her Seroquel lately. Daughter mentions that patient has not been sleeping well. Denies any episodes of agitation.    Past Medical History:  Diagnosis Date   Acute metabolic encephalopathy 2/84/1324   COPD (chronic obstructive pulmonary disease) ([place])    Hypertension    Thyroid disease     History reviewed. No pertinent surgical history.  History reviewed. No pertinent family history.  Social History   Socioeconomic History   Marital status: Widowed    Spouse name: Not on file   Number of children: Not on file   Years of education: Not on file   Highest education level: Not on file  Occupational History   Not on file  Tobacco Use   Smoking status: Former   Smokeless tobacco: Never  Substance and Sexual Activity   Alcohol use: Not Currently   Drug use: Never   Sexual activity: Not on file  Other Topics Concern   Not on file  Social History Narrative   Not on file   Social Determinants of Health   Financial Resource Strain: Low Risk    Difficulty of Paying Living Expenses: Not hard at all  Food Insecurity: No Food Insecurity   Worried About Running Out of Food in the Last Year: Never true   [place] in the Last Year: Never true  Transportation Needs: No Transportation Needs   Lack  of Transportation (Medical): No   Lack of Transportation (Non-Medical): No  Physical Activity: Inactive   Days of Exercise per Week: 0 days   Minutes of Exercise per Session: 0 min  Stress: No Stress Concern Present   Feeling of Stress : Not at all  Social Connections: Socially Isolated   Frequency of Communication with Friends and Family: More than three times a week   Frequency of Social Gatherings with Friends and Family: More than three times a week   Attends Religious Services: Never   Marine scientist or Organizations: No   Attends Archivist Meetings: Never   Marital Status: Widowed  Human resources officer Violence: Not At Risk   Fear of Current or Ex-Partner: No   Emotionally Abused: No   Physically Abused: No   Sexually Abused: No    Outpatient Medications Prior to Visit  Medication Sig Dispense Refill   albuterol (VENTOLIN HFA) 108 (90 Base) MCG/ACT inhaler Inhale 2 puffs into the lungs every 6 (six) hours as needed.     aspirin 81 MG EC tablet Take 81 mg by mouth daily.     Cholecalciferol (EQL VITAMIN D3) 50 MCG (2000 UT) CAPS Take 1 capsule by mouth daily.     feeding supplement (ENSURE ENLIVE / ENSURE PLUS) LIQD Take 237 mLs by mouth 2 (two) times daily between meals. 237 mL 12  levothyroxine (SYNTHROID) 75 MCG tablet TAKE 1 TABLET BY MOUTH EVERY DAY 90 tablet 3   lisinopril (ZESTRIL) 20 MG tablet Take 1 tablet (20 mg total) by mouth daily. 90 tablet 1   Multiple Vitamins-Minerals (CENTRUM ADULTS PO) Take 1 capsule by mouth daily.     pravastatin (PRAVACHOL) 20 MG tablet TAKE 1 TABLET BY MOUTH AT BEDTIME 90 tablet 3   QUEtiapine (SEROQUEL) 25 MG tablet TAKE 1 TABLET BY MOUTH AT BEDTIME 30 tablet 2   TRELEGY ELLIPTA 100-62.5-25 MCG/INH AEPB Inhale 1 puff into the lungs daily.     No facility-administered medications prior to visit.    No Known Allergies  ROS Review of Systems  Constitutional:  Negative for chills and fever.  HENT:  Negative for  congestion, sinus pressure, sinus pain and sore throat.   Eyes:  Negative for pain and discharge.  Respiratory:  Negative for cough and shortness of breath.   Cardiovascular:  Negative for chest pain and palpitations.  Gastrointestinal:  Negative for abdominal pain, constipation, diarrhea, nausea and vomiting.  Endocrine: Negative for polydipsia and polyuria.  Genitourinary:  Negative for dysuria and hematuria.  Musculoskeletal:  Positive for arthralgias, back pain and gait problem. Negative for neck pain and neck stiffness.  Skin:  Negative for rash.  Neurological:  Negative for dizziness and weakness.  Psychiatric/Behavioral:  Negative for agitation, behavioral problems, dysphoric mood and sleep disturbance.      Objective:    Physical Exam Vitals reviewed.  Constitutional:      General: She is not in acute distress.    Appearance: She is not diaphoretic.  HENT:     Head: Normocephalic and atraumatic.     Nose: Nose normal.     Mouth/Throat:     Mouth: Mucous membranes are moist.  Eyes:     General: No scleral icterus.    Extraocular Movements: Extraocular movements intact.  Cardiovascular:     Rate and Rhythm: Normal rate and regular rhythm.     Pulses: Normal pulses.     Heart sounds: Normal heart sounds. No murmur heard. Pulmonary:     Breath sounds: Normal breath sounds. No wheezing or rales.  Abdominal:     Palpations: Abdomen is soft.     Tenderness: There is no abdominal tenderness.  Musculoskeletal:     Cervical back: Neck supple. No tenderness.     Right lower leg: No edema.     Left lower leg: No edema.  Skin:    General: Skin is warm.     Findings: No rash.  Neurological:     General: No focal deficit present.     Mental Status: She is alert and oriented to person, place, and time.     Cranial Nerves: No cranial nerve deficit.     Sensory: No sensory deficit.     Motor: No weakness.  Psychiatric:        Mood and Affect: Mood normal.        Behavior:  Behavior normal.    BP (!) 202/98 (BP Location: Left Arm, Patient Position: Sitting, Cuff Size: Normal)   Pulse 81   Resp 18   Ht '5\' 4"'  (1.626 m)   Wt 104 lb (47.2 kg)   SpO2 97%   BMI 17.85 kg/m  Wt Readings from Last 3 Encounters:  06/15/21 104 lb (47.2 kg)  03/15/21 105 lb 12.8 oz (48 kg)  02/07/21 106 lb 1.9 oz (48.1 kg)     Health Maintenance Due  Topic  Date Due   TETANUS/TDAP  Never done   Zoster Vaccines- Shingrix (1 of 2) Never done   PNA vac Low Risk Adult (1 of 2 - PCV13) Never done    There are no preventive care reminders to display for this patient.  Lab Results  Component Value Date   TSH 2.774 01/06/2021   Lab Results  Component Value Date   WBC 4.3 03/01/2021   HGB 12.4 03/01/2021   HCT 36.7 03/01/2021   MCV 101 (H) 03/01/2021   PLT 231 03/01/2021   Lab Results  Component Value Date   NA 130 (L) 03/01/2021   K 4.6 03/01/2021   CO2 22 03/01/2021   GLUCOSE 95 03/01/2021   BUN 15 03/01/2021   CREATININE 0.94 03/01/2021   BILITOT 1.2 01/06/2021   ALKPHOS 34 (L) 01/06/2021   AST 41 01/06/2021   ALT 28 01/06/2021   PROT 6.3 (L) 01/06/2021   ALBUMIN 4.1 01/06/2021   CALCIUM 9.2 03/01/2021   ANIONGAP 5 01/09/2021   EGFR 57 (L) 03/01/2021   No results found for: CHOL No results found for: HDL No results found for: LDLCALC No results found for: TRIG No results found for: CHOLHDL No results found for: HGBA1C    Assessment & Plan:   Problem List Items Addressed This Visit       Encounter for general adult medical examination with abnormal findings - Primary   Annual exam as documented. Counseling done  re healthy lifestyle involving commitment to 150 minutes exercise per week, heart healthy diet, and attaining healthy weight.The importance of adequate sleep also discussed. Changes in health habits are decided on by the patient with goals and time frames  set for achieving them. Immunization and cancer screening needs are specifically  addressed at this visit.     Relevant Orders  CBC with Differential  Comprehensive metabolic panel  Hemoglobin A1c  Lipid panel  TSH    Cardiovascular and Mediastinum   Hypertensive urgency    BP Readings from Last 1 Encounters:  06/15/21 (!) 202/98  Uncontrolled with Lisinopril 20 mg QD Home BP readings reviewed, around 160-180/80-90 Would avoid increasing dose of Lisinopril as she had hyponatremia Added Amlodipine 5 mg QD and Hydralazine 25 mg TID Counseled for compliance with the medications Advised DASH diet and moderate exercise/walking as tolerated      Relevant Medications   amLODipine (NORVASC) 5 MG tablet   hydrALAZINE (APRESOLINE) 25 MG tablet     Other   Agitation    Appears to be related to progressive dementia/cognitive decline Had started Seroquel PRN, but she has not been taking it Advised daughter to give it as it will improve her sleep and decrease agitation, which might be helpful for BP control as well.         Other Visit Diagnoses     Need for immunization against influenza       Relevant Orders   Flu Vaccine QUAD High Dose(Fluad) (Completed)       Meds ordered this encounter  Medications   amLODipine (NORVASC) 5 MG tablet    Sig: Take 1 tablet (5 mg total) by mouth daily.    Dispense:  90 tablet    Refill:  0   hydrALAZINE (APRESOLINE) 25 MG tablet    Sig: Take 1 tablet (25 mg total) by mouth 3 (three) times daily.    Dispense:  90 tablet    Refill:  0    Follow-up: Return in 4 weeks (on 07/13/2021)  for HTN.    Lindell Spar, MD

## 2021-06-15 NOTE — Assessment & Plan Note (Signed)
BP Readings from Last 1 Encounters:  06/15/21 (!) 202/98   Uncontrolled with Lisinopril 20 mg QD Home BP readings reviewed, around 160-180/80-90 Would avoid increasing dose of Lisinopril as she had hyponatremia Added Amlodipine 5 mg QD and Hydralazine 25 mg TID Counseled for compliance with the medications Advised DASH diet and moderate exercise/walking as tolerated

## 2021-06-15 NOTE — Progress Notes (Signed)
Subjective:   Theresa Bush is a 87 y.o. female who presents for Medicare Annual (Subsequent) preventive examination.  The patient was seen in the office.  Review of Systems           Objective:    There were no vitals filed for this visit. There is no height or weight on file to calculate BMI.  Advanced Directives 01/06/2021 01/06/2021  Does Patient Have a Medical Advance Directive? No No  Would patient like information on creating a medical advance directive? No - Guardian declined;No - Patient declined No - Patient declined    Current Medications (verified) Outpatient Encounter Medications as of 06/15/2021  Medication Sig   albuterol (VENTOLIN HFA) 108 (90 Base) MCG/ACT inhaler Inhale 2 puffs into the lungs every 6 (six) hours as needed.   amLODipine (NORVASC) 5 MG tablet Take 1 tablet (5 mg total) by mouth daily.   aspirin 81 MG EC tablet Take 81 mg by mouth daily.   Cholecalciferol (EQL VITAMIN D3) 50 MCG (2000 UT) CAPS Take 1 capsule by mouth daily.   feeding supplement (ENSURE ENLIVE / ENSURE PLUS) LIQD Take 237 mLs by mouth 2 (two) times daily between meals.   hydrALAZINE (APRESOLINE) 25 MG tablet Take 1 tablet (25 mg total) by mouth 3 (three) times daily.   levothyroxine (SYNTHROID) 75 MCG tablet TAKE 1 TABLET BY MOUTH EVERY DAY   lisinopril (ZESTRIL) 20 MG tablet Take 1 tablet (20 mg total) by mouth daily.   Multiple Vitamins-Minerals (CENTRUM ADULTS PO) Take 1 capsule by mouth daily.   pravastatin (PRAVACHOL) 20 MG tablet TAKE 1 TABLET BY MOUTH AT BEDTIME   QUEtiapine (SEROQUEL) 25 MG tablet TAKE 1 TABLET BY MOUTH AT BEDTIME   TRELEGY ELLIPTA 100-62.5-25 MCG/INH AEPB Inhale 1 puff into the lungs daily.   No facility-administered encounter medications on file as of 06/15/2021.    Allergies (verified) Patient has no known allergies.   History: Past Medical History:  Diagnosis Date   Acute metabolic encephalopathy 01/06/2021   COPD (chronic obstructive  pulmonary disease) ([facility])    Hypertension    Thyroid disease    No past surgical history on file. No family history on file. Social History   Socioeconomic History   Marital status: Widowed    Spouse name: Not on file   Number of children: Not on file   Years of education: Not on file   Highest education level: Not on file  Occupational History   Not on file  Tobacco Use   Smoking status: Former   Smokeless tobacco: Never  Substance and Sexual Activity   Alcohol use: Not Currently   Drug use: Never   Sexual activity: Not on file  Other Topics Concern   Not on file  Social History Narrative   Not on file   Social Determinants of Health   Financial Resource Strain: Not on file  Food Insecurity: Not on file  Transportation Needs: Not on file  Physical Activity: Not on file  Stress: Not on file  Social Connections: Not on file    Tobacco Counseling Counseling given: Not Answered   Clinical Intake:                 Diabetic?No         Activities of Daily Living In your present state of health, do you have any difficulty performing the following activities: 01/06/2021  Hearing? Y  Vision? N  Difficulty concentrating or making decisions? Y  Walking or  climbing stairs? N  Dressing or bathing? N  Doing errands, shopping? Y  Some recent data might be hidden    Patient Care Team: Anabel Halon, MD as PCP - General (Internal Medicine)  Indicate any recent Medical Services you may have received from other than [facility] providers in the past year (date may be approximate).     Assessment:   This is a routine wellness examination for [place].  Hearing/Vision screen No results found.  Dietary issues and exercise activities discussed:     Goals Addressed   None   Depression Screen PHQ 2/9 Scores 06/15/2021 03/15/2021 02/07/2021 02/07/2021  PHQ - 2 Score 0 0 1 0    Fall Risk Fall Risk  06/15/2021 03/15/2021 02/07/2021 09/10/2019  Falls in the past  year? 0 0 1 0  Comment - - - Emmi Telephone Survey: data to providers prior to load  Number falls in past yr: 0 0 1 -  Injury with Fall? 0 0 1 -  Risk for fall due to : No Fall Risks No Fall Risks History of fall(s);Impaired balance/gait;Impaired mobility -  Follow up Falls evaluation completed Falls evaluation completed Falls evaluation completed;Education provided;Falls prevention discussed;Follow up appointment -    FALL RISK PREVENTION PERTAINING TO THE HOME:  Any stairs in or around the home? No  If so, are there any without handrails? No  Home free of loose throw rugs in walkways, pet beds, electrical cords, etc? Yes  Adequate lighting in your home to reduce risk of falls? Yes   ASSISTIVE DEVICES UTILIZED TO PREVENT FALLS:  Life alert? Yes  Use of a cane, walker or w/c? Yes Grab bars in the bathroom? No  Shower chair or bench in shower? No  Elevated toilet seat or a handicapped toilet? No   TIMED UP AND GO:  Was the test performed? No .  Length of time to ambulate 10 feet: NA sec.     Cognitive Function:        Immunizations Immunization History  Administered Date(s) Administered   Influenza-Unspecified 08/16/2018   Moderna SARS-COV2 Booster Vaccination 03/25/2021   PFIZER(Purple Top)SARS-COV-2 Vaccination 12/05/2019, 12/26/2019, 07/17/2020    TDAP status: Due, Education has been provided regarding the importance of this vaccine. Advised may receive this vaccine at local pharmacy or Health Dept. Aware to provide a copy of the vaccination record if obtained from local pharmacy or Health Dept. Verbalized acceptance and understanding.  Flu Vaccine status: Up to date  Pneumococcal vaccine status: Due, Education has been provided regarding the importance of this vaccine. Advised may receive this vaccine at local pharmacy or Health Dept. Aware to provide a copy of the vaccination record if obtained from local pharmacy or Health Dept. Verbalized acceptance and  understanding.  Covid-19 vaccine status: Completed vaccines  Qualifies for Shingles Vaccine? Yes   Zostavax completed No   Shingrix Completed?: No.    Education has been provided regarding the importance of this vaccine. Patient has been advised to call insurance company to determine out of pocket expense if they have not yet received this vaccine. Advised may also receive vaccine at local pharmacy or Health Dept. Verbalized acceptance and understanding.  Screening Tests Health Maintenance  Topic Date Due   TETANUS/TDAP  Never done   Zoster Vaccines- Shingrix (1 of 2) Never done   PNA vac Low Risk Adult (1 of 2 - PCV13) Never done   INFLUENZA VACCINE  05/16/2021   COVID-19 Vaccine  Completed   HPV VACCINES  Aged Out   DEXA SCAN  Discontinued    Health Maintenance  Health Maintenance Due  Topic Date Due   TETANUS/TDAP  Never done   Zoster Vaccines- Shingrix (1 of 2) Never done   PNA vac Low Risk Adult (1 of 2 - PCV13) Never done   INFLUENZA VACCINE  05/16/2021    Colorectal cancer screening: No longer required.   Mammogram status: No longer required due to age.    Lung Cancer Screening: (Low Dose CT Chest recommended if Age 49-80 years, 30 pack-year currently smoking OR have quit w/in 15years.) does not qualify.   Lung Cancer Screening Referral: NA  Additional Screening:  Hepatitis C Screening: does not qualify; Completed   Vision Screening: Recommended annual ophthalmology exams for early detection of glaucoma and other disorders of the eye. Is the patient up to date with their annual eye exam?  Yes  Who is the provider or what is the name of the office in which the patient attends annual eye exams?  If pt is not established with a provider, would they like to be referred to a provider to establish care? No .   Dental Screening: Recommended annual dental exams for proper oral hygiene  Community Resource Referral / Chronic Care Management: CRR required this visit?   No   CCM required this visit?  No      Plan:     I have personally reviewed and noted the following in the patient's chart:   Medical and social history Use of alcohol, tobacco or illicit drugs  Current medications and supplements including opioid prescriptions.  Functional ability and status Nutritional status Physical activity Advanced directives List of other physicians Hospitalizations, surgeries, and ER visits in previous 12 months Vitals Screenings to include cognitive, depression, and falls Referrals and appointments  In addition, I have reviewed and discussed with patient certain preventive protocols, quality metrics, and best practice recommendations. A written personalized care plan for preventive services as well as general preventive health recommendations were provided to patient.     Park Breed, CMA   06/15/2021   Nurse Notes: The patient was in office. The provider was in office and was Dr Trena Platt.

## 2021-06-16 LAB — CBC WITH DIFFERENTIAL/PLATELET
Basophils Absolute: 0.1 10*3/uL (ref 0.0–0.2)
Basos: 2 %
EOS (ABSOLUTE): 0.1 10*3/uL (ref 0.0–0.4)
Eos: 1 %
Hematocrit: 33.8 % — ABNORMAL LOW (ref 34.0–46.6)
Hemoglobin: 11.6 g/dL (ref 11.1–15.9)
Immature Grans (Abs): 0 10*3/uL (ref 0.0–0.1)
Immature Granulocytes: 0 %
Lymphocytes Absolute: 0.8 10*3/uL (ref 0.7–3.1)
Lymphs: 15 %
MCH: 30.2 pg (ref 26.6–33.0)
MCHC: 34.3 g/dL (ref 31.5–35.7)
MCV: 88 fL (ref 79–97)
Monocytes Absolute: 0.5 10*3/uL (ref 0.1–0.9)
Monocytes: 11 %
Neutrophils Absolute: 3.5 10*3/uL (ref 1.4–7.0)
Neutrophils: 71 %
Platelets: 295 10*3/uL (ref 150–450)
RBC: 3.84 x10E6/uL (ref 3.77–5.28)
RDW: 14.1 % (ref 11.7–15.4)
WBC: 5 10*3/uL (ref 3.4–10.8)

## 2021-06-16 LAB — COMPREHENSIVE METABOLIC PANEL
ALT: 15 IU/L (ref 0–32)
AST: 25 IU/L (ref 0–40)
Albumin/Globulin Ratio: 2.3 — ABNORMAL HIGH (ref 1.2–2.2)
Albumin: 5 g/dL — ABNORMAL HIGH (ref 3.5–4.6)
Alkaline Phosphatase: 52 IU/L (ref 44–121)
BUN/Creatinine Ratio: 15 (ref 12–28)
BUN: 13 mg/dL (ref 10–36)
Bilirubin Total: 0.2 mg/dL (ref 0.0–1.2)
CO2: 25 mmol/L (ref 20–29)
Calcium: 11.2 mg/dL — ABNORMAL HIGH (ref 8.7–10.3)
Chloride: 90 mmol/L — ABNORMAL LOW (ref 96–106)
Creatinine, Ser: 0.86 mg/dL (ref 0.57–1.00)
Globulin, Total: 2.2 g/dL (ref 1.5–4.5)
Glucose: 98 mg/dL (ref 65–99)
Potassium: 4.1 mmol/L (ref 3.5–5.2)
Sodium: 132 mmol/L — ABNORMAL LOW (ref 134–144)
Total Protein: 7.2 g/dL (ref 6.0–8.5)
eGFR: 63 mL/min/{1.73_m2} (ref >59)

## 2021-06-16 LAB — HEMOGLOBIN A1C
Est. average glucose Bld gHb Est-mCnc: 108 mg/dL
Hgb A1c MFr Bld: 5.4 % (ref 4.8–5.6)

## 2021-06-16 LAB — LIPID PANEL
Chol/HDL Ratio: 1.8 ratio (ref 0.0–4.4)
Cholesterol, Total: 228 mg/dL — ABNORMAL HIGH (ref 100–199)
HDL: 125 mg/dL (ref >39)
LDL Chol Calc (NIH): 93 mg/dL (ref 0–99)
Triglycerides: 60 mg/dL (ref 0–149)
VLDL Cholesterol Cal: 10 mg/dL (ref 5–40)

## 2021-06-16 LAB — TSH: TSH: 3.29 u[IU]/mL (ref 0.450–4.500)

## 2021-06-21 ENCOUNTER — Encounter: Payer: Self-pay | Admitting: *Deleted

## 2021-06-23 ENCOUNTER — Other Ambulatory Visit: Payer: Self-pay

## 2021-06-23 ENCOUNTER — Ambulatory Visit (INDEPENDENT_AMBULATORY_CARE_PROVIDER_SITE_OTHER): Payer: Medicare HMO | Admitting: Internal Medicine

## 2021-06-23 ENCOUNTER — Encounter: Payer: Self-pay | Admitting: Internal Medicine

## 2021-06-23 VITALS — BP 182/84 | HR 96 | Resp 18 | Ht 64.0 in | Wt 109.1 lb

## 2021-06-23 DIAGNOSIS — M7989 Other specified soft tissue disorders: Secondary | ICD-10-CM | POA: Insufficient documentation

## 2021-06-23 DIAGNOSIS — I160 Hypertensive urgency: Secondary | ICD-10-CM | POA: Diagnosis not present

## 2021-06-23 MED ORDER — NEBIVOLOL HCL 5 MG PO TABS: 5.0000 mg | ORAL_TABLET | Freq: Every day | ORAL | 0 refills | Status: DC

## 2021-06-23 NOTE — Patient Instructions (Signed)
Please stop taking Amlodipine and start taking Bystolic.  Please try to keep legs elevated and use compression socks for leg swelling. Avoid prolonged standing.

## 2021-06-23 NOTE — Assessment & Plan Note (Signed)
Could be from Amlodipine - discontinue and started Bystolic Avoid diuretic as she has chronic hyponatremia Advised to keep legs elevated Compression socks

## 2021-06-23 NOTE — Assessment & Plan Note (Signed)
BP Readings from Last 1 Encounters:  06/23/21 (!) 182/84   Uncontrolled with Lisinopril 20 mg QD Home BP readings reviewed, around 120-150/60-80 Would avoid increasing dose of Lisinopril as she had hyponatremia DC Amlodipine 5 mg QD due to LE swelling, started Bystolic 5 mg QD Continue Hydralazine 25 mg TID for now Counseled for compliance with the medications Advised DASH diet and moderate exercise/walking as tolerated

## 2021-06-23 NOTE — Progress Notes (Addendum)
Acute Office Visit  Subjective:    Patient ID: Theresa Bush, female    DOB: [birth year], 87 y.o.   MRN: [id number]  Chief Complaint  Patient presents with   Foot Swelling    Pt has had leg and feet swelling since 06-21-21 she has not been taking amlodipine as hospice nurse said this could cause swelling    HPI Patient is in today for evaluation of leg swelling for last 3 days. She denies any dyspnea or orthopnea currently. Denies any cough. She denies any recent injury. She had recently started taking Amlodipine and her hospice nurse told her that her leg swelling could be due to Amlodipine and she stopped taking it. Her leg swelling has slowly improved.  Her BP was elevated today, but better compared to last visit. Her daughter states that patient's BP has been better at home, between 120-150/60-80 since starting Hydralazine. She denies any headache, dizziness, chest pain or palpitations.  Past Medical History:  Diagnosis Date   Acute metabolic encephalopathy 01/22/8118   COPD (chronic obstructive pulmonary disease) ([place])    Hypertension    Thyroid disease     History reviewed. No pertinent surgical history.  History reviewed. No pertinent family history.  Social History   Socioeconomic History   Marital status: Widowed    Spouse name: Not on file   Number of children: Not on file   Years of education: Not on file   Highest education level: Not on file  Occupational History   Not on file  Tobacco Use   Smoking status: Former   Smokeless tobacco: Never  Substance and Sexual Activity   Alcohol use: Not Currently   Drug use: Never   Sexual activity: Not on file  Other Topics Concern   Not on file  Social History Narrative   Not on file   Social Determinants of Health   Financial Resource Strain: Low Risk    Difficulty of Paying Living Expenses: Not hard at all  Food Insecurity: No Food Insecurity   Worried About Running Out of Food in the Last Year: Never  true   [place] in the Last Year: Never true  Transportation Needs: No Transportation Needs   Lack of Transportation (Medical): No   Lack of Transportation (Non-Medical): No  Physical Activity: Inactive   Days of Exercise per Week: 0 days   Minutes of Exercise per Session: 0 min  Stress: No Stress Concern Present   Feeling of Stress : Not at all  Social Connections: Socially Isolated   Frequency of Communication with Friends and Family: More than three times a week   Frequency of Social Gatherings with Friends and Family: More than three times a week   Attends Religious Services: Never   Marine scientist or Organizations: No   Attends Archivist Meetings: Never   Marital Status: Widowed  Human resources officer Violence: Not At Risk   Fear of Current or Ex-Partner: No   Emotionally Abused: No   Physically Abused: No   Sexually Abused: No    Outpatient Medications Prior to Visit  Medication Sig Dispense Refill   albuterol (VENTOLIN HFA) 108 (90 Base) MCG/ACT inhaler Inhale 2 puffs into the lungs every 6 (six) hours as needed.     aspirin 81 MG EC tablet Take 81 mg by mouth daily.     Cholecalciferol (EQL VITAMIN D3) 50 MCG (2000 UT) CAPS Take 1 capsule by mouth daily.     feeding  supplement (ENSURE ENLIVE / ENSURE PLUS) LIQD Take 237 mLs by mouth 2 (two) times daily between meals. 237 mL 12   hydrALAZINE (APRESOLINE) 25 MG tablet Take 1 tablet (25 mg total) by mouth 3 (three) times daily. 90 tablet 0   levothyroxine (SYNTHROID) 75 MCG tablet TAKE 1 TABLET BY MOUTH EVERY DAY 90 tablet 3   lisinopril (ZESTRIL) 20 MG tablet Take 1 tablet (20 mg total) by mouth daily. 90 tablet 1   Multiple Vitamins-Minerals (CENTRUM ADULTS PO) Take 1 capsule by mouth daily.     pravastatin (PRAVACHOL) 20 MG tablet TAKE 1 TABLET BY MOUTH AT BEDTIME 90 tablet 3   QUEtiapine (SEROQUEL) 25 MG tablet TAKE 1 TABLET BY MOUTH AT BEDTIME 30 tablet 2   TRELEGY ELLIPTA 100-62.5-25 MCG/INH AEPB  Inhale 1 puff into the lungs daily.     amLODipine (NORVASC) 5 MG tablet Take 1 tablet (5 mg total) by mouth daily. (Patient not taking: Reported on 06/23/2021) 90 tablet 0   No facility-administered medications prior to visit.    No Known Allergies  Review of Systems  Constitutional:  Negative for chills and fever.  HENT:  Negative for congestion, sinus pressure, sinus pain and sore throat.   Eyes:  Negative for pain and discharge.  Respiratory:  Negative for cough and shortness of breath.   Cardiovascular:  Positive for leg swelling. Negative for chest pain and palpitations.  Gastrointestinal:  Negative for abdominal pain, constipation, diarrhea, nausea and vomiting.  Endocrine: Negative for polydipsia and polyuria.  Genitourinary:  Negative for dysuria and hematuria.  Musculoskeletal:  Positive for arthralgias, back pain and gait problem. Negative for neck pain and neck stiffness.  Skin:  Negative for rash.  Neurological:  Negative for dizziness and weakness.  Psychiatric/Behavioral:  Negative for agitation, behavioral problems, dysphoric mood and sleep disturbance.       Objective:    Physical Exam Vitals reviewed.  Constitutional:      General: She is not in acute distress.    Appearance: She is not diaphoretic.  HENT:     Head: Normocephalic and atraumatic.     Nose: Nose normal.     Mouth/Throat:     Mouth: Mucous membranes are moist.  Eyes:     General: No scleral icterus.    Extraocular Movements: Extraocular movements intact.  Cardiovascular:     Rate and Rhythm: Normal rate and regular rhythm.     Pulses: Normal pulses.     Heart sounds: Normal heart sounds. No murmur heard. Pulmonary:     Breath sounds: Normal breath sounds. No wheezing or rales.  Abdominal:     Palpations: Abdomen is soft.     Tenderness: There is no abdominal tenderness.  Musculoskeletal:     Cervical back: Neck supple. No tenderness.     Right lower leg: Edema (1+) present.     Left  lower leg: Edema (2+) present.  Skin:    General: Skin is warm.     Findings: No rash.  Neurological:     General: No focal deficit present.     Mental Status: She is alert and oriented to person, place, and time.     Cranial Nerves: No cranial nerve deficit.     Sensory: No sensory deficit.     Motor: No weakness.  Psychiatric:        Mood and Affect: Mood normal.        Behavior: Behavior normal.    BP (!) 182/84 (BP Location: Left  Arm, Patient Position: Sitting, Cuff Size: Normal)   Pulse 96   Resp 18   Ht '5\' 4"'  (1.626 m)   Wt 109 lb 1.3 oz (49.5 kg)   SpO2 94%   BMI 18.72 kg/m  Wt Readings from Last 3 Encounters:  06/23/21 109 lb 1.3 oz (49.5 kg)  06/15/21 104 lb (47.2 kg)  03/15/21 105 lb 12.8 oz (48 kg)    Health Maintenance Due  Topic Date Due   TETANUS/TDAP  Never done   Zoster Vaccines- Shingrix (1 of 2) Never done   PNA vac Low Risk Adult (1 of 2 - PCV13) Never done    There are no preventive care reminders to display for this patient.   Lab Results  Component Value Date   TSH 3.290 06/15/2021   Lab Results  Component Value Date   WBC 5.0 06/15/2021   HGB 11.6 06/15/2021   HCT 33.8 (L) 06/15/2021   MCV 88 06/15/2021   PLT 295 06/15/2021   Lab Results  Component Value Date   NA 132 (L) 06/15/2021   K 4.1 06/15/2021   CO2 25 06/15/2021   GLUCOSE 98 06/15/2021   BUN 13 06/15/2021   CREATININE 0.86 06/15/2021   BILITOT 0.2 06/15/2021   ALKPHOS 52 06/15/2021   AST 25 06/15/2021   ALT 15 06/15/2021   PROT 7.2 06/15/2021   ALBUMIN 5.0 (H) 06/15/2021   CALCIUM 11.2 (H) 06/15/2021   ANIONGAP 5 01/09/2021   EGFR 63 06/15/2021   Lab Results  Component Value Date   CHOL 228 (H) 06/15/2021   Lab Results  Component Value Date   HDL 125 06/15/2021   Lab Results  Component Value Date   LDLCALC 93 06/15/2021   Lab Results  Component Value Date   TRIG 60 06/15/2021   Lab Results  Component Value Date   CHOLHDL 1.8 06/15/2021   Lab  Results  Component Value Date   HGBA1C 5.4 06/15/2021       Assessment & Plan:   Problem List Items Addressed This Visit       Cardiovascular and Mediastinum   Hypertensive urgency - Primary    BP Readings from Last 1 Encounters:  06/23/21 (!) 182/84  Uncontrolled with Lisinopril 20 mg QD Home BP readings reviewed, around 120-150/60-80 Would avoid increasing dose of Lisinopril as she had hyponatremia DC Amlodipine 5 mg QD due to LE swelling, started Bystolic 5 mg QD Continue Hydralazine 25 mg TID for now Counseled for compliance with the medications Advised DASH diet and moderate exercise/walking as tolerated      Relevant Medications   nebivolol (BYSTOLIC) 5 MG tablet     Other   Leg swelling    Could be from Amlodipine - discontinue and started Bystolic Avoid diuretic as she has chronic hyponatremia Advised to keep legs elevated Compression socks        Meds ordered this encounter  Medications   nebivolol (BYSTOLIC) 5 MG tablet    Sig: Take 1 tablet (5 mg total) by mouth daily.    Dispense:  30 tablet    Refill:  0     Maritta Kief Keith Rake, MD

## 2021-06-29 ENCOUNTER — Telehealth: Payer: Self-pay

## 2021-06-29 DIAGNOSIS — Z515 Encounter for palliative care: Secondary | ICD-10-CM | POA: Diagnosis not present

## 2021-06-29 DIAGNOSIS — J449 Chronic obstructive pulmonary disease, unspecified: Secondary | ICD-10-CM | POA: Diagnosis not present

## 2021-06-29 DIAGNOSIS — R451 Restlessness and agitation: Secondary | ICD-10-CM | POA: Diagnosis not present

## 2021-06-29 NOTE — Telephone Encounter (Signed)
Cala Bradford from Palliative Care called she had a visit with patient and said the patient was very aggregated in the afternoon, asking can something be called into [facility]. Kimberly call back # [phone].

## 2021-06-30 NOTE — Telephone Encounter (Signed)
LVM for Cala Bradford to call the office regarding pt

## 2021-07-01 NOTE — Telephone Encounter (Signed)
Theresa Bush notified with verbal understanding

## 2021-07-06 ENCOUNTER — Ambulatory Visit: Payer: Medicare HMO | Admitting: Internal Medicine

## 2021-07-13 ENCOUNTER — Ambulatory Visit: Payer: Medicare HMO | Admitting: Internal Medicine

## 2021-07-19 ENCOUNTER — Other Ambulatory Visit: Payer: Self-pay | Admitting: Internal Medicine

## 2021-07-19 DIAGNOSIS — I160 Hypertensive urgency: Secondary | ICD-10-CM

## 2021-07-21 ENCOUNTER — Encounter: Payer: Self-pay | Admitting: Internal Medicine

## 2021-07-21 ENCOUNTER — Ambulatory Visit (INDEPENDENT_AMBULATORY_CARE_PROVIDER_SITE_OTHER): Payer: Medicare HMO | Admitting: Internal Medicine

## 2021-07-21 ENCOUNTER — Other Ambulatory Visit: Payer: Self-pay

## 2021-07-21 VITALS — BP 184/66 | HR 66 | Resp 18 | Ht 61.5 in | Wt 106.0 lb

## 2021-07-21 DIAGNOSIS — I517 Cardiomegaly: Secondary | ICD-10-CM | POA: Diagnosis not present

## 2021-07-21 DIAGNOSIS — R451 Restlessness and agitation: Secondary | ICD-10-CM

## 2021-07-21 DIAGNOSIS — J449 Chronic obstructive pulmonary disease, unspecified: Secondary | ICD-10-CM

## 2021-07-21 DIAGNOSIS — I160 Hypertensive urgency: Secondary | ICD-10-CM

## 2021-07-21 MED ORDER — HYDRALAZINE HCL 50 MG PO TABS: 50.0000 mg | ORAL_TABLET | Freq: Three times a day (TID) | ORAL | 1 refills | Status: DC

## 2021-07-21 NOTE — Progress Notes (Signed)
Acute Office Visit  Subjective:    Patient ID: Theresa Bush, female    DOB: 03-Aug-1926, 87 y.o.   MRN: [id number]  Chief Complaint  Patient presents with   Follow-up    4 week follow up HTN bp has been all over the place     HPI Patient is in today for f/u of HTN.  Her BP was significantly elevated in the office today.  Her home BP readings were reviewed, which are between 120-180/60-80.  She denies any headache, dizziness, chest pain, dyspnea or palpitations.  Her leg swelling has improved since stopping amlodipine.  She has been tolerating Bystolic and hydralazine well.  Her last CMP showed elevated calcium.  Her BUN and S. Cr. are WNL.  She has been taking Seroquel for insomnia and agitation.  She does not have many agitative episodes now.  Daughter does not report any wandering episodes now. Past Medical History:  Diagnosis Date   Acute metabolic encephalopathy 8/67/5449   COPD (chronic obstructive pulmonary disease) ([place])    Hypertension    Thyroid disease     History reviewed. No pertinent surgical history.  History reviewed. No pertinent family history.  Social History   Socioeconomic History   Marital status: Widowed    Spouse name: Not on file   Number of children: Not on file   Years of education: Not on file   Highest education level: Not on file  Occupational History   Not on file  Tobacco Use   Smoking status: Former   Smokeless tobacco: Never  Substance and Sexual Activity   Alcohol use: Not Currently   Drug use: Never   Sexual activity: Not on file  Other Topics Concern   Not on file  Social History Narrative   Not on file   Social Determinants of Health   Financial Resource Strain: Low Risk    Difficulty of Paying Living Expenses: Not hard at all  Food Insecurity: No Food Insecurity   Worried About Running Out of Food in the Last Year: Never true   [place] in the Last Year: Never true  Transportation Needs: No Transportation  Needs   Lack of Transportation (Medical): No   Lack of Transportation (Non-Medical): No  Physical Activity: Inactive   Days of Exercise per Week: 0 days   Minutes of Exercise per Session: 0 min  Stress: No Stress Concern Present   Feeling of Stress : Not at all  Social Connections: Socially Isolated   Frequency of Communication with Friends and Family: More than three times a week   Frequency of Social Gatherings with Friends and Family: More than three times a week   Attends Religious Services: Never   Marine scientist or Organizations: No   Attends Archivist Meetings: Never   Marital Status: Widowed  Human resources officer Violence: Not At Risk   Fear of Current or Ex-Partner: No   Emotionally Abused: No   Physically Abused: No   Sexually Abused: No    Outpatient Medications Prior to Visit  Medication Sig Dispense Refill   albuterol (VENTOLIN HFA) 108 (90 Base) MCG/ACT inhaler Inhale 2 puffs into the lungs every 6 (six) hours as needed.     aspirin 81 MG EC tablet Take 81 mg by mouth daily.     Cholecalciferol (EQL VITAMIN D3) 50 MCG (2000 UT) CAPS Take 1 capsule by mouth daily.     feeding supplement (ENSURE ENLIVE / ENSURE PLUS) LIQD Take  237 mLs by mouth 2 (two) times daily between meals. 237 mL 12   levothyroxine (SYNTHROID) 75 MCG tablet TAKE 1 TABLET BY MOUTH EVERY DAY 90 tablet 3   lisinopril (ZESTRIL) 20 MG tablet Take 1 tablet (20 mg total) by mouth daily. 90 tablet 1   Multiple Vitamins-Minerals (CENTRUM ADULTS PO) Take 1 capsule by mouth daily.     nebivolol (BYSTOLIC) 5 MG tablet TAKE 1 TABLET BY MOUTH EVERY DAY 30 tablet 0   pravastatin (PRAVACHOL) 20 MG tablet TAKE 1 TABLET BY MOUTH AT BEDTIME 90 tablet 3   QUEtiapine (SEROQUEL) 25 MG tablet TAKE 1 TABLET BY MOUTH AT BEDTIME 30 tablet 2   TRELEGY ELLIPTA 100-62.5-25 MCG/INH AEPB Inhale 1 puff into the lungs daily.     hydrALAZINE (APRESOLINE) 25 MG tablet TAKE 1 TABLET BY MOUTH THREE TIMES DAILY 90 tablet  0   No facility-administered medications prior to visit.    No Known Allergies  Review of Systems  Constitutional:  Negative for chills and fever.  HENT:  Negative for congestion, sinus pressure, sinus pain and sore throat.   Eyes:  Negative for pain and discharge.  Respiratory:  Negative for cough and shortness of breath.   Cardiovascular:  Positive for leg swelling. Negative for chest pain and palpitations.  Gastrointestinal:  Negative for abdominal pain, constipation, diarrhea, nausea and vomiting.  Endocrine: Negative for polydipsia and polyuria.  Genitourinary:  Negative for dysuria and hematuria.  Musculoskeletal:  Positive for arthralgias, back pain and gait problem. Negative for neck pain and neck stiffness.  Skin:  Negative for rash.  Neurological:  Negative for dizziness and weakness.  Psychiatric/Behavioral:  Negative for agitation, behavioral problems, dysphoric mood and sleep disturbance.       Objective:    Physical Exam Vitals reviewed.  Constitutional:      General: She is not in acute distress.    Appearance: She is not diaphoretic.  HENT:     Head: Normocephalic and atraumatic.     Nose: Nose normal.     Mouth/Throat:     Mouth: Mucous membranes are moist.  Eyes:     General: No scleral icterus.    Extraocular Movements: Extraocular movements intact.  Cardiovascular:     Rate and Rhythm: Normal rate and regular rhythm.     Pulses: Normal pulses.     Heart sounds: Normal heart sounds. No murmur heard. Pulmonary:     Breath sounds: Normal breath sounds. No wheezing or rales.  Abdominal:     Palpations: Abdomen is soft.     Tenderness: There is no abdominal tenderness.  Musculoskeletal:     Cervical back: Neck supple. No tenderness.     Right lower leg: Edema (Trace) present.     Left lower leg: Edema (Trace) present.  Skin:    General: Skin is warm.     Findings: No rash.  Neurological:     General: No focal deficit present.     Mental Status:  She is alert and oriented to person, place, and time.     Cranial Nerves: No cranial nerve deficit.     Sensory: No sensory deficit.     Motor: No weakness.  Psychiatric:        Mood and Affect: Mood normal.        Behavior: Behavior normal.    BP (!) 184/66 (BP Location: Left Arm, Cuff Size: Normal)   Pulse 66   Resp 18   Ht 5' 1.5" (1.562 m)  Wt 106 lb 0.6 oz (48.1 kg)   SpO2 93%   BMI 19.71 kg/m  Wt Readings from Last 3 Encounters:  07/21/21 106 lb 0.6 oz (48.1 kg)  06/23/21 109 lb 1.3 oz (49.5 kg)  06/15/21 104 lb (47.2 kg)    Health Maintenance Due  Topic Date Due   TETANUS/TDAP  Never done   Zoster Vaccines- Shingrix (1 of 2) Never done    There are no preventive care reminders to display for this patient.   Lab Results  Component Value Date   TSH 3.290 06/15/2021   Lab Results  Component Value Date   WBC 5.0 06/15/2021   HGB 11.6 06/15/2021   HCT 33.8 (L) 06/15/2021   MCV 88 06/15/2021   PLT 295 06/15/2021   Lab Results  Component Value Date   NA 132 (L) 06/15/2021   K 4.1 06/15/2021   CO2 25 06/15/2021   GLUCOSE 98 06/15/2021   BUN 13 06/15/2021   CREATININE 0.86 06/15/2021   BILITOT 0.2 06/15/2021   ALKPHOS 52 06/15/2021   AST 25 06/15/2021   ALT 15 06/15/2021   PROT 7.2 06/15/2021   ALBUMIN 5.0 (H) 06/15/2021   CALCIUM 11.2 (H) 06/15/2021   ANIONGAP 5 01/09/2021   EGFR 63 06/15/2021   Lab Results  Component Value Date   CHOL 228 (H) 06/15/2021   Lab Results  Component Value Date   HDL 125 06/15/2021   Lab Results  Component Value Date   LDLCALC 93 06/15/2021   Lab Results  Component Value Date   TRIG 60 06/15/2021   Lab Results  Component Value Date   CHOLHDL 1.8 06/15/2021   Lab Results  Component Value Date   HGBA1C 5.4 06/15/2021       Assessment & Plan:   Problem List Items Addressed This Visit       Cardiovascular and Mediastinum   Hypertensive urgency - Primary    BP Readings from Last 1 Encounters:   07/21/21 (!) 184/66  Uncontrolled with Lisinopril 20 mg QD, Bystolic 5 mg QD and Hydralazine 25 mg TID Home BP readings reviewed, around 120-180/60-80 Would avoid increasing dose of Lisinopril as she had hyponatremia Increased dose of Hydralazine 50 mg TID Counseled for compliance with the medications Advised DASH diet and moderate exercise/walking as tolerated      Relevant Medications   hydrALAZINE (APRESOLINE) 50 MG tablet   Other Relevant Orders   EKG 12-Lead (Completed)   LVH (left ventricular hypertrophy)    EKG: Sinus rhythm.  LVH.  ST depression in leads II and III. LVH could be due to longstanding HTN Referred to cardiology for further evaluation for LVH and possible CAD      Relevant Medications   hydrALAZINE (APRESOLINE) 50 MG tablet   Other Relevant Orders   Ambulatory referral to Cardiology     Respiratory   Chronic obstructive pulmonary disease ([place])    Well-controlled with Trelegy and PRN Albuterol        Other   Agitation    Appears to be related to progressive dementia/cognitive decline Seroquel PRN Advised daughter to give it as it will improve her sleep and decrease agitation, which might be helpful for BP control as well.      Hypercalcemia    Noted in last CMP Check calcium and intact PTH      Relevant Orders   PTH, Intact and Calcium     Meds ordered this encounter  Medications   hydrALAZINE (APRESOLINE) 50 MG  tablet    Sig: Take 1 tablet (50 mg total) by mouth 3 (three) times daily.    Dispense:  90 tablet    Refill:  1     Mordecai Tindol Keith Rake, MD

## 2021-07-21 NOTE — Patient Instructions (Signed)
Please start taking Hydralazine 50 mg instead of 25 mg.  Continue to take other medications as prescribed.

## 2021-07-22 LAB — PTH, INTACT AND CALCIUM
Calcium: 9.6 mg/dL (ref 8.7–10.3)
PTH: 33 pg/mL (ref 15–65)

## 2021-07-22 NOTE — Assessment & Plan Note (Signed)
Noted in last CMP Check calcium and intact PTH

## 2021-07-22 NOTE — Assessment & Plan Note (Signed)
Appears to be related to progressive dementia/cognitive decline Seroquel PRN Advised daughter to give it as it will improve her sleep and decrease agitation, which might be helpful for BP control as well.

## 2021-07-22 NOTE — Assessment & Plan Note (Signed)
EKG: Sinus rhythm.  LVH.  ST depression in leads II and III. LVH could be due to longstanding HTN Referred to cardiology for further evaluation for LVH and possible CAD

## 2021-07-22 NOTE — Assessment & Plan Note (Signed)
Well-controlled with Trelegy and PRN Albuterol 

## 2021-07-22 NOTE — Assessment & Plan Note (Signed)
BP Readings from Last 1 Encounters:  07/21/21 (!) 184/66   Uncontrolled with Lisinopril 20 mg QD, Bystolic 5 mg QD and Hydralazine 25 mg TID Home BP readings reviewed, around 120-180/60-80 Would avoid increasing dose of Lisinopril as she had hyponatremia Increased dose of Hydralazine 50 mg TID Counseled for compliance with the medications Advised DASH diet and moderate exercise/walking as tolerated

## 2021-07-26 ENCOUNTER — Encounter: Payer: Self-pay | Admitting: *Deleted

## 2021-08-03 ENCOUNTER — Other Ambulatory Visit: Payer: Self-pay | Admitting: Internal Medicine

## 2021-08-03 DIAGNOSIS — I160 Hypertensive urgency: Secondary | ICD-10-CM

## 2021-08-12 DIAGNOSIS — Z23 Encounter for immunization: Secondary | ICD-10-CM | POA: Diagnosis not present

## 2021-08-18 ENCOUNTER — Encounter: Payer: Self-pay | Admitting: Internal Medicine

## 2021-08-18 ENCOUNTER — Other Ambulatory Visit: Payer: Self-pay

## 2021-08-18 ENCOUNTER — Ambulatory Visit (INDEPENDENT_AMBULATORY_CARE_PROVIDER_SITE_OTHER): Payer: Medicare HMO | Admitting: Internal Medicine

## 2021-08-18 VITALS — BP 174/80 | HR 70 | Resp 20 | Ht 62.0 in | Wt 105.1 lb

## 2021-08-18 DIAGNOSIS — K30 Functional dyspepsia: Secondary | ICD-10-CM | POA: Diagnosis not present

## 2021-08-18 DIAGNOSIS — I10 Essential (primary) hypertension: Secondary | ICD-10-CM | POA: Diagnosis not present

## 2021-08-18 MED ORDER — FAMOTIDINE 20 MG PO TABS: 20.0000 mg | ORAL_TABLET | Freq: Two times a day (BID) | ORAL | 2 refills | Status: AC

## 2021-08-18 MED ORDER — LISINOPRIL 40 MG PO TABS: 40.0000 mg | ORAL_TABLET | Freq: Every day | ORAL | 1 refills | Status: DC

## 2021-08-18 NOTE — Assessment & Plan Note (Signed)
BP Readings from Last 1 Encounters:  08/18/21 (!) 174/80   Uncontrolled with Lisinopril 20 mg QD, Bystolic 5 mg QD and Hydralazine 50 mg TID Home BP readings reviewed, around 160-180/70-80 Increased Lisinopril to 40 mg QD, had hyponatremia in the past, which has improved Counseled for compliance with the medications Advised DASH diet and moderate exercise/walking as tolerated

## 2021-08-18 NOTE — Progress Notes (Signed)
Established Patient Office Visit  Subjective:  Patient ID: Theresa Bush, female    DOB: [birth year]  Age: 87 y.o. MRN: [id number]  CC:  Chief Complaint  Patient presents with   Follow-up    4 week follow up HTN at home its in 160s or 170s on top few times in 200 pt also has been having a lot of indigestion this started after 07-21-21 appt     HPI Theresa Bush is a 87 y.o. female with past medical history of HTN, COPD, hypothyroidism and HLD who presents for f/u of HTN.  HTN: She had significantly elevated BP in the office today on multiple measurements. She denies any headache, dizziness, chest pain, dyspnea or palpitations. Daughter states that her BP runs high around 160-170/80-90 at home. She has been giving her medications regularly. She is going to see Cardiologist in the next week.  She also c/o indigestion since last visit. Denies any change in appetite. Denies nausea, vomiting or diarrhea. Feels bloated at times.  Past Medical History:  Diagnosis Date   Acute metabolic encephalopathy 2/75/1700   COPD (chronic obstructive pulmonary disease) ([place])    Hypertension    Thyroid disease     History reviewed. No pertinent surgical history.  History reviewed. No pertinent family history.  Social History   Socioeconomic History   Marital status: Widowed    Spouse name: Not on file   Number of children: Not on file   Years of education: Not on file   Highest education level: Not on file  Occupational History   Not on file  Tobacco Use   Smoking status: Former   Smokeless tobacco: Never  Substance and Sexual Activity   Alcohol use: Not Currently   Drug use: Never   Sexual activity: Not on file  Other Topics Concern   Not on file  Social History Narrative   Not on file   Social Determinants of Health   Financial Resource Strain: Low Risk    Difficulty of Paying Living Expenses: Not hard at all  Food Insecurity: No Food Insecurity   Worried About  Running Out of Food in the Last Year: Never true   [place] in the Last Year: Never true  Transportation Needs: No Transportation Needs   Lack of Transportation (Medical): No   Lack of Transportation (Non-Medical): No  Physical Activity: Inactive   Days of Exercise per Week: 0 days   Minutes of Exercise per Session: 0 min  Stress: No Stress Concern Present   Feeling of Stress : Not at all  Social Connections: Socially Isolated   Frequency of Communication with Friends and Family: More than three times a week   Frequency of Social Gatherings with Friends and Family: More than three times a week   Attends Religious Services: Never   Marine scientist or Organizations: No   Attends Archivist Meetings: Never   Marital Status: Widowed  Human resources officer Violence: Not At Risk   Fear of Current or Ex-Partner: No   Emotionally Abused: No   Physically Abused: No   Sexually Abused: No    Outpatient Medications Prior to Visit  Medication Sig Dispense Refill   albuterol (VENTOLIN HFA) 108 (90 Base) MCG/ACT inhaler Inhale 2 puffs into the lungs every 6 (six) hours as needed.     aspirin 81 MG EC tablet Take 81 mg by mouth daily.     Cholecalciferol (EQL VITAMIN D3) 50 MCG (2000 UT) CAPS  Take 1 capsule by mouth daily.     feeding supplement (ENSURE ENLIVE / ENSURE PLUS) LIQD Take 237 mLs by mouth 2 (two) times daily between meals. 237 mL 12   hydrALAZINE (APRESOLINE) 50 MG tablet Take 1 tablet (50 mg total) by mouth 3 (three) times daily. 90 tablet 1   levothyroxine (SYNTHROID) 75 MCG tablet TAKE 1 TABLET BY MOUTH EVERY DAY 90 tablet 3   Multiple Vitamins-Minerals (CENTRUM ADULTS PO) Take 1 capsule by mouth daily.     nebivolol (BYSTOLIC) 5 MG tablet TAKE 1 TABLET BY MOUTH EVERY DAY 30 tablet 4   pravastatin (PRAVACHOL) 20 MG tablet TAKE 1 TABLET BY MOUTH AT BEDTIME 90 tablet 3   QUEtiapine (SEROQUEL) 25 MG tablet TAKE 1 TABLET BY MOUTH AT BEDTIME 30 tablet 2   TRELEGY  ELLIPTA 100-62.5-25 MCG/INH AEPB Inhale 1 puff into the lungs daily.     lisinopril (ZESTRIL) 20 MG tablet Take 1 tablet (20 mg total) by mouth daily. 90 tablet 1   No facility-administered medications prior to visit.    No Known Allergies  ROS Review of Systems  Constitutional:  Negative for chills and fever.  HENT:  Negative for congestion, sinus pressure, sinus pain and sore throat.   Eyes:  Negative for pain and discharge.  Respiratory:  Negative for cough and shortness of breath.   Cardiovascular:  Positive for leg swelling. Negative for chest pain and palpitations.  Gastrointestinal:  Negative for abdominal pain, constipation, diarrhea, nausea and vomiting.  Endocrine: Negative for polydipsia and polyuria.  Genitourinary:  Negative for dysuria and hematuria.  Musculoskeletal:  Positive for arthralgias, back pain and gait problem. Negative for neck pain and neck stiffness.  Skin:  Negative for rash.  Neurological:  Negative for dizziness and weakness.  Psychiatric/Behavioral:  Negative for agitation, behavioral problems, dysphoric mood and sleep disturbance.      Objective:    Physical Exam Vitals reviewed.  Constitutional:      General: She is not in acute distress.    Appearance: She is not diaphoretic.  HENT:     Head: Normocephalic and atraumatic.     Nose: Nose normal.     Mouth/Throat:     Mouth: Mucous membranes are moist.  Eyes:     General: No scleral icterus.    Extraocular Movements: Extraocular movements intact.  Cardiovascular:     Rate and Rhythm: Normal rate and regular rhythm.     Pulses: Normal pulses.     Heart sounds: Normal heart sounds. No murmur heard. Pulmonary:     Breath sounds: Normal breath sounds. No wheezing or rales.  Abdominal:     Palpations: Abdomen is soft.     Tenderness: There is no abdominal tenderness.  Musculoskeletal:     Cervical back: Neck supple. No tenderness.     Right lower leg: Edema (Trace) present.     Left lower  leg: Edema (Trace) present.  Skin:    General: Skin is warm.     Findings: No rash.  Neurological:     General: No focal deficit present.     Mental Status: She is alert and oriented to person, place, and time.     Cranial Nerves: No cranial nerve deficit.     Sensory: No sensory deficit.     Motor: No weakness.  Psychiatric:        Mood and Affect: Mood normal.        Behavior: Behavior normal.    BP (!) 174/80 (  BP Location: Right Arm, Cuff Size: Normal)   Pulse 70   Resp 20   Ht _0  (1.575 m)   Wt 105 lb 1.9 oz (47.7 kg)   SpO2 91%   BMI 19.23 kg/m  Wt Readings from Last 3 Encounters:  08/18/21 105 lb 1.9 oz (47.7 kg)  07/21/21 106 lb 0.6 oz (48.1 kg)  06/23/21 109 lb 1.3 oz (49.5 kg)    Lab Results  Component Value Date   TSH 3.290 06/15/2021   Lab Results  Component Value Date   WBC 5.0 06/15/2021   HGB 11.6 06/15/2021   HCT 33.8 (L) 06/15/2021   MCV 88 06/15/2021   PLT 295 06/15/2021   Lab Results  Component Value Date   NA 132 (L) 06/15/2021   K 4.1 06/15/2021   CO2 25 06/15/2021   GLUCOSE 98 06/15/2021   BUN 13 06/15/2021   CREATININE 0.86 06/15/2021   BILITOT 0.2 06/15/2021   ALKPHOS 52 06/15/2021   AST 25 06/15/2021   ALT 15 06/15/2021   PROT 7.2 06/15/2021   ALBUMIN 5.0 (H) 06/15/2021   CALCIUM 9.6 07/21/2021   ANIONGAP 5 01/09/2021   EGFR 63 06/15/2021   Lab Results  Component Value Date   CHOL 228 (H) 06/15/2021   Lab Results  Component Value Date   HDL 125 06/15/2021   Lab Results  Component Value Date   LDLCALC 93 06/15/2021   Lab Results  Component Value Date   TRIG 60 06/15/2021   Lab Results  Component Value Date   CHOLHDL 1.8 06/15/2021   Lab Results  Component Value Date   HGBA1C 5.4 06/15/2021      Assessment & Plan:   Problem List Items Addressed This Visit       Cardiovascular and Mediastinum   Essential hypertension - Primary    BP Readings from Last 1 Encounters:  08/18/21 (!) 174/80  Uncontrolled  with Lisinopril 20 mg QD, Bystolic 5 mg QD and Hydralazine 50 mg TID Home BP readings reviewed, around 160-180/70-80 Increased Lisinopril to 40 mg QD, had hyponatremia in the past, which has improved Counseled for compliance with the medications Advised DASH diet and moderate exercise/walking as tolerated      Relevant Medications   lisinopril (ZESTRIL) 40 MG tablet     Other   Indigestion    Could be due to multiple medications and poor PO intake Started Pepcid Advised to eat at regular intervals      Relevant Medications   famotidine (PEPCID) 20 MG tablet    Meds ordered this encounter  Medications   lisinopril (ZESTRIL) 40 MG tablet    Sig: Take 1 tablet (40 mg total) by mouth daily.    Dispense:  90 tablet    Refill:  1    Dose change   famotidine (PEPCID) 20 MG tablet    Sig: Take 1 tablet (20 mg total) by mouth 2 (two) times daily.    Dispense:  60 tablet    Refill:  2    Follow-up: Return in about 2 months (around 10/30/21).    Lindell Spar, MD

## 2021-08-18 NOTE — Assessment & Plan Note (Signed)
Could be due to multiple medications and poor PO intake Started Pepcid Advised to eat at regular intervals

## 2021-08-18 NOTE — Patient Instructions (Signed)
Please start taking Lisinopril 40 mg instead of 20 mg.  Please take Pepcid for indigestion. Okay to take GasX for bloating or gassy sensation.  Continue taking other medications as prescribed.

## 2021-08-24 ENCOUNTER — Ambulatory Visit: Payer: Medicare HMO | Admitting: Cardiology

## 2021-09-03 ENCOUNTER — Other Ambulatory Visit: Payer: Self-pay | Admitting: Internal Medicine

## 2021-09-03 DIAGNOSIS — I160 Hypertensive urgency: Secondary | ICD-10-CM

## 2021-09-06 ENCOUNTER — Other Ambulatory Visit: Payer: Self-pay | Admitting: Internal Medicine

## 2021-09-06 DIAGNOSIS — I160 Hypertensive urgency: Secondary | ICD-10-CM

## 2021-09-26 ENCOUNTER — Other Ambulatory Visit: Payer: Self-pay | Admitting: Internal Medicine

## 2021-09-26 DIAGNOSIS — I160 Hypertensive urgency: Secondary | ICD-10-CM

## 2021-09-26 DIAGNOSIS — I10 Essential (primary) hypertension: Secondary | ICD-10-CM

## 2021-10-12 ENCOUNTER — Encounter (HOSPITAL_COMMUNITY): Payer: Self-pay

## 2021-10-12 ENCOUNTER — Emergency Department (HOSPITAL_COMMUNITY): Payer: Medicare HMO

## 2021-10-12 ENCOUNTER — Inpatient Hospital Stay (HOSPITAL_COMMUNITY)
Admission: EM | Admit: 2021-10-12 | Discharge: 2021-11-16 | DRG: 190 | Disposition: E | Payer: Medicare HMO | Attending: Internal Medicine | Admitting: Internal Medicine

## 2021-10-12 ENCOUNTER — Other Ambulatory Visit: Payer: Self-pay

## 2021-10-12 DIAGNOSIS — F03911 Unspecified dementia, unspecified severity, with agitation: Secondary | ICD-10-CM | POA: Diagnosis present

## 2021-10-12 DIAGNOSIS — Z681 Body mass index (BMI) 19 or less, adult: Secondary | ICD-10-CM | POA: Diagnosis not present

## 2021-10-12 DIAGNOSIS — Z8249 Family history of ischemic heart disease and other diseases of the circulatory system: Secondary | ICD-10-CM | POA: Diagnosis not present

## 2021-10-12 DIAGNOSIS — I5033 Acute on chronic diastolic (congestive) heart failure: Secondary | ICD-10-CM | POA: Diagnosis not present

## 2021-10-12 DIAGNOSIS — F03918 Unspecified dementia, unspecified severity, with other behavioral disturbance: Secondary | ICD-10-CM | POA: Diagnosis not present

## 2021-10-12 DIAGNOSIS — I10 Essential (primary) hypertension: Secondary | ICD-10-CM | POA: Diagnosis not present

## 2021-10-12 DIAGNOSIS — R5383 Other fatigue: Secondary | ICD-10-CM | POA: Diagnosis present

## 2021-10-12 DIAGNOSIS — M545 Low back pain, unspecified: Secondary | ICD-10-CM | POA: Diagnosis present

## 2021-10-12 DIAGNOSIS — Z515 Encounter for palliative care: Secondary | ICD-10-CM | POA: Diagnosis not present

## 2021-10-12 DIAGNOSIS — J9601 Acute respiratory failure with hypoxia: Secondary | ICD-10-CM | POA: Diagnosis present

## 2021-10-12 DIAGNOSIS — Z7982 Long term (current) use of aspirin: Secondary | ICD-10-CM | POA: Diagnosis not present

## 2021-10-12 DIAGNOSIS — I509 Heart failure, unspecified: Secondary | ICD-10-CM | POA: Diagnosis not present

## 2021-10-12 DIAGNOSIS — Z87891 Personal history of nicotine dependence: Secondary | ICD-10-CM

## 2021-10-12 DIAGNOSIS — E871 Hypo-osmolality and hyponatremia: Secondary | ICD-10-CM | POA: Diagnosis present

## 2021-10-12 DIAGNOSIS — E039 Hypothyroidism, unspecified: Secondary | ICD-10-CM | POA: Diagnosis present

## 2021-10-12 DIAGNOSIS — Z20822 Contact with and (suspected) exposure to covid-19: Secondary | ICD-10-CM | POA: Diagnosis present

## 2021-10-12 DIAGNOSIS — J441 Chronic obstructive pulmonary disease with (acute) exacerbation: Principal | ICD-10-CM | POA: Diagnosis present

## 2021-10-12 DIAGNOSIS — J9811 Atelectasis: Secondary | ICD-10-CM | POA: Diagnosis present

## 2021-10-12 DIAGNOSIS — E785 Hyperlipidemia, unspecified: Secondary | ICD-10-CM | POA: Diagnosis not present

## 2021-10-12 DIAGNOSIS — R339 Retention of urine, unspecified: Secondary | ICD-10-CM | POA: Diagnosis present

## 2021-10-12 DIAGNOSIS — Z79890 Hormone replacement therapy (postmenopausal): Secondary | ICD-10-CM

## 2021-10-12 DIAGNOSIS — Z79899 Other long term (current) drug therapy: Secondary | ICD-10-CM | POA: Diagnosis not present

## 2021-10-12 DIAGNOSIS — R627 Adult failure to thrive: Secondary | ICD-10-CM | POA: Diagnosis present

## 2021-10-12 DIAGNOSIS — I110 Hypertensive heart disease with heart failure: Secondary | ICD-10-CM | POA: Diagnosis present

## 2021-10-12 DIAGNOSIS — R069 Unspecified abnormalities of breathing: Secondary | ICD-10-CM | POA: Diagnosis not present

## 2021-10-12 DIAGNOSIS — Z66 Do not resuscitate: Secondary | ICD-10-CM | POA: Diagnosis present

## 2021-10-12 DIAGNOSIS — R001 Bradycardia, unspecified: Secondary | ICD-10-CM | POA: Diagnosis present

## 2021-10-12 DIAGNOSIS — J449 Chronic obstructive pulmonary disease, unspecified: Secondary | ICD-10-CM | POA: Diagnosis present

## 2021-10-12 DIAGNOSIS — Z7189 Other specified counseling: Secondary | ICD-10-CM | POA: Diagnosis not present

## 2021-10-12 DIAGNOSIS — R0609 Other forms of dyspnea: Secondary | ICD-10-CM | POA: Diagnosis not present

## 2021-10-12 DIAGNOSIS — R0602 Shortness of breath: Secondary | ICD-10-CM | POA: Diagnosis not present

## 2021-10-12 DIAGNOSIS — R4189 Other symptoms and signs involving cognitive functions and awareness: Secondary | ICD-10-CM | POA: Diagnosis present

## 2021-10-12 DIAGNOSIS — N179 Acute kidney failure, unspecified: Secondary | ICD-10-CM | POA: Diagnosis not present

## 2021-10-12 DIAGNOSIS — F0390 Unspecified dementia without behavioral disturbance: Secondary | ICD-10-CM | POA: Diagnosis present

## 2021-10-12 LAB — CBC WITH DIFFERENTIAL/PLATELET
Abs Immature Granulocytes: 0.07 10*3/uL (ref 0.00–0.07)
Basophils Absolute: 0 10*3/uL (ref 0.0–0.1)
Basophils Relative: 0 %
Eosinophils Absolute: 0 10*3/uL (ref 0.0–0.5)
Eosinophils Relative: 0 %
HCT: 37.7 % (ref 36.0–46.0)
Hemoglobin: 12.6 g/dL (ref 12.0–15.0)
Immature Granulocytes: 1 %
Lymphocytes Relative: 3 %
Lymphs Abs: 0.4 10*3/uL — ABNORMAL LOW (ref 0.7–4.0)
MCH: 32.3 pg (ref 26.0–34.0)
MCHC: 33.4 g/dL (ref 30.0–36.0)
MCV: 96.7 fL (ref 80.0–100.0)
Monocytes Absolute: 1.1 10*3/uL — ABNORMAL HIGH (ref 0.1–1.0)
Monocytes Relative: 8 %
Neutro Abs: 12.3 10*3/uL — ABNORMAL HIGH (ref 1.7–7.7)
Neutrophils Relative %: 88 %
Platelets: 185 10*3/uL (ref 150–400)
RBC: 3.9 MIL/uL (ref 3.87–5.11)
RDW: 13.8 % (ref 11.5–15.5)
WBC: 13.8 10*3/uL — ABNORMAL HIGH (ref 4.0–10.5)
nRBC: 0 % (ref 0.0–0.2)

## 2021-10-12 LAB — BASIC METABOLIC PANEL
Anion gap: 9 (ref 5–15)
BUN: 20 mg/dL (ref 8–23)
CO2: 23 mmol/L (ref 22–32)
Calcium: 9 mg/dL (ref 8.9–10.3)
Chloride: 95 mmol/L — ABNORMAL LOW (ref 98–111)
Creatinine, Ser: 0.75 mg/dL (ref 0.44–1.00)
GFR, Estimated: >60 mL/min (ref >60)
Glucose, Bld: 118 mg/dL — ABNORMAL HIGH (ref 70–99)
Potassium: 3.8 mmol/L (ref 3.5–5.1)
Sodium: 127 mmol/L — ABNORMAL LOW (ref 135–145)

## 2021-10-12 LAB — TROPONIN I (HIGH SENSITIVITY)
Troponin I (High Sensitivity): 17 ng/L (ref <18)
Troponin I (High Sensitivity): 18 ng/L — ABNORMAL HIGH (ref <18)

## 2021-10-12 LAB — RESP PANEL BY RT-PCR (FLU A&B, COVID) ARPGX2
Influenza A by PCR: NEGATIVE
Influenza B by PCR: NEGATIVE
SARS Coronavirus 2 by RT PCR: NEGATIVE

## 2021-10-12 LAB — BRAIN NATRIURETIC PEPTIDE: B Natriuretic Peptide: 815 pg/mL — ABNORMAL HIGH (ref 0.0–100.0)

## 2021-10-12 MED ORDER — HEPARIN SODIUM (PORCINE) 5000 UNIT/ML IJ SOLN
5000.0000 [IU] | Freq: Three times a day (TID) | INTRAMUSCULAR | Status: DC
  Administered 2021-10-12 – 2021-10-14 (×5): 5000 [IU] via SUBCUTANEOUS
  Filled 2021-10-12 (×6): qty 1

## 2021-10-12 MED ORDER — FLUTICASONE-UMECLIDIN-VILANT 100-62.5-25 MCG/ACT IN AEPB: 1.0000 | INHALATION_SPRAY | Freq: Every day | RESPIRATORY_TRACT | Status: DC

## 2021-10-12 MED ORDER — LEVOTHYROXINE SODIUM 75 MCG PO TABS
75.0000 ug | ORAL_TABLET | Freq: Every day | ORAL | Status: DC
  Administered 2021-10-13 – 2021-10-14 (×2): 75 ug via ORAL
  Filled 2021-10-12 (×2): qty 1

## 2021-10-12 MED ORDER — ONDANSETRON HCL 4 MG/2ML IJ SOLN
4.0000 mg | Freq: Once | INTRAMUSCULAR | Status: AC
  Administered 2021-10-12: 13:00:00 4 mg via INTRAVENOUS
  Filled 2021-10-12: qty 2

## 2021-10-12 MED ORDER — FUROSEMIDE 10 MG/ML IJ SOLN
40.0000 mg | Freq: Once | INTRAMUSCULAR | Status: AC
  Administered 2021-10-12: 15:00:00 40 mg via INTRAVENOUS
  Filled 2021-10-12: qty 4

## 2021-10-12 MED ORDER — BISACODYL 10 MG RE SUPP: 10.0000 mg | Freq: Every day | RECTAL | Status: DC | PRN

## 2021-10-12 MED ORDER — QUETIAPINE FUMARATE 25 MG PO TABS
25.0000 mg | ORAL_TABLET | Freq: Every day | ORAL | Status: DC
  Administered 2021-10-12 – 2021-10-14 (×3): 25 mg via ORAL
  Filled 2021-10-12 (×3): qty 1

## 2021-10-12 MED ORDER — TRAZODONE HCL 50 MG PO TABS
50.0000 mg | ORAL_TABLET | Freq: Every evening | ORAL | Status: DC | PRN
  Administered 2021-10-14: 02:00:00 50 mg via ORAL
  Filled 2021-10-12: qty 1

## 2021-10-12 MED ORDER — AZITHROMYCIN 250 MG PO TABS
500.0000 mg | ORAL_TABLET | Freq: Every day | ORAL | Status: DC
  Administered 2021-10-12 – 2021-10-13 (×2): 500 mg via ORAL
  Filled 2021-10-12 (×2): qty 2

## 2021-10-12 MED ORDER — METHYLPREDNISOLONE SODIUM SUCC 40 MG IJ SOLR
40.0000 mg | Freq: Two times a day (BID) | INTRAMUSCULAR | Status: DC
  Administered 2021-10-12 – 2021-10-13 (×2): 40 mg via INTRAVENOUS
  Filled 2021-10-12 (×2): qty 1

## 2021-10-12 MED ORDER — IPRATROPIUM-ALBUTEROL 0.5-2.5 (3) MG/3ML IN SOLN
3.0000 mL | Freq: Once | RESPIRATORY_TRACT | Status: AC
  Administered 2021-10-12: 13:00:00 3 mL via RESPIRATORY_TRACT
  Filled 2021-10-12: qty 3

## 2021-10-12 MED ORDER — ENSURE ENLIVE PO LIQD
237.0000 mL | Freq: Two times a day (BID) | ORAL | Status: DC
  Administered 2021-10-13 – 2021-10-14 (×2): 237 mL via ORAL

## 2021-10-12 MED ORDER — NEBIVOLOL HCL 10 MG PO TABS
5.0000 mg | ORAL_TABLET | Freq: Every day | ORAL | Status: DC
  Administered 2021-10-12 – 2021-10-14 (×3): 5 mg via ORAL
  Filled 2021-10-12 (×4): qty 1

## 2021-10-12 MED ORDER — ACETAMINOPHEN 650 MG RE SUPP: 650.0000 mg | Freq: Four times a day (QID) | RECTAL | Status: DC | PRN

## 2021-10-12 MED ORDER — AMLODIPINE BESYLATE 5 MG PO TABS
10.0000 mg | ORAL_TABLET | Freq: Every day | ORAL | Status: DC
  Administered 2021-10-12 – 2021-10-13 (×2): 10 mg via ORAL
  Filled 2021-10-12 (×3): qty 2

## 2021-10-12 MED ORDER — POLYETHYLENE GLYCOL 3350 17 G PO PACK: 17.0000 g | PACK | Freq: Every day | ORAL | Status: DC | PRN

## 2021-10-12 MED ORDER — SODIUM CHLORIDE 0.9% FLUSH
3.0000 mL | Freq: Two times a day (BID) | INTRAVENOUS | Status: DC
  Administered 2021-10-14 – 2021-10-17 (×7): 3 mL via INTRAVENOUS

## 2021-10-12 MED ORDER — ASPIRIN EC 81 MG PO TBEC
81.0000 mg | DELAYED_RELEASE_TABLET | Freq: Every day | ORAL | Status: DC
  Administered 2021-10-12 – 2021-10-14 (×3): 81 mg via ORAL
  Filled 2021-10-12 (×3): qty 1

## 2021-10-12 MED ORDER — UMECLIDINIUM BROMIDE 62.5 MCG/ACT IN AEPB
1.0000 | INHALATION_SPRAY | Freq: Every day | RESPIRATORY_TRACT | Status: DC
  Administered 2021-10-13 – 2021-10-16 (×2): 1 via RESPIRATORY_TRACT
  Filled 2021-10-12: qty 7

## 2021-10-12 MED ORDER — ALPRAZOLAM 0.5 MG PO TABS
0.5000 mg | ORAL_TABLET | Freq: Two times a day (BID) | ORAL | Status: DC | PRN
  Administered 2021-10-12 – 2021-10-13 (×2): 0.5 mg via ORAL
  Filled 2021-10-12 (×3): qty 1

## 2021-10-12 MED ORDER — HYDRALAZINE HCL 20 MG/ML IJ SOLN
10.0000 mg | Freq: Four times a day (QID) | INTRAMUSCULAR | Status: DC | PRN
  Administered 2021-10-12: 18:00:00 10 mg via INTRAVENOUS
  Filled 2021-10-12: qty 1

## 2021-10-12 MED ORDER — HYDRALAZINE HCL 25 MG PO TABS
100.0000 mg | ORAL_TABLET | Freq: Three times a day (TID) | ORAL | Status: DC
  Administered 2021-10-12 – 2021-10-13 (×4): 100 mg via ORAL
  Filled 2021-10-12 (×4): qty 4

## 2021-10-12 MED ORDER — PRAVASTATIN SODIUM 10 MG PO TABS: 20.0000 mg | ORAL_TABLET | Freq: Every day | ORAL | Status: DC

## 2021-10-12 MED ORDER — ONDANSETRON HCL 4 MG/2ML IJ SOLN: 4.0000 mg | Freq: Four times a day (QID) | INTRAMUSCULAR | Status: DC | PRN

## 2021-10-12 MED ORDER — FUROSEMIDE 10 MG/ML IJ SOLN
20.0000 mg | Freq: Once | INTRAMUSCULAR | Status: AC
  Administered 2021-10-12: 18:00:00 20 mg via INTRAVENOUS
  Filled 2021-10-12: qty 2

## 2021-10-12 MED ORDER — ACETAMINOPHEN 325 MG PO TABS
650.0000 mg | ORAL_TABLET | Freq: Four times a day (QID) | ORAL | Status: DC | PRN
  Administered 2021-10-14: 02:00:00 650 mg via ORAL
  Filled 2021-10-12: qty 2

## 2021-10-12 MED ORDER — VITAMIN D 25 MCG (1000 UNIT) PO TABS
2000.0000 [IU] | ORAL_TABLET | Freq: Every day | ORAL | Status: DC
  Administered 2021-10-12 – 2021-10-14 (×3): 2000 [IU] via ORAL
  Filled 2021-10-12 (×3): qty 2

## 2021-10-12 MED ORDER — SODIUM CHLORIDE 0.9 % IV SOLN: 250.0000 mL | INTRAVENOUS | Status: DC | PRN

## 2021-10-12 MED ORDER — FLUTICASONE FUROATE-VILANTEROL 100-25 MCG/ACT IN AEPB
1.0000 | INHALATION_SPRAY | Freq: Every day | RESPIRATORY_TRACT | Status: DC
  Administered 2021-10-13 – 2021-10-16 (×2): 1 via RESPIRATORY_TRACT
  Filled 2021-10-12: qty 28

## 2021-10-12 MED ORDER — FUROSEMIDE 10 MG/ML IJ SOLN
40.0000 mg | Freq: Every day | INTRAMUSCULAR | Status: DC
  Filled 2021-10-12: qty 4

## 2021-10-12 MED ORDER — LABETALOL HCL 5 MG/ML IV SOLN
10.0000 mg | INTRAVENOUS | Status: DC | PRN
  Filled 2021-10-12: qty 4

## 2021-10-12 MED ORDER — ONDANSETRON HCL 4 MG PO TABS: 4.0000 mg | ORAL_TABLET | Freq: Four times a day (QID) | ORAL | Status: DC | PRN

## 2021-10-12 MED ORDER — METHYLPREDNISOLONE SODIUM SUCC 125 MG IJ SOLR
125.0000 mg | Freq: Once | INTRAMUSCULAR | Status: AC
  Administered 2021-10-12: 13:00:00 125 mg via INTRAVENOUS
  Filled 2021-10-12: qty 2

## 2021-10-12 MED ORDER — PANTOPRAZOLE SODIUM 40 MG PO TBEC
40.0000 mg | DELAYED_RELEASE_TABLET | Freq: Every day | ORAL | Status: DC
  Administered 2021-10-12 – 2021-10-14 (×3): 40 mg via ORAL
  Filled 2021-10-12 (×3): qty 1

## 2021-10-12 MED ORDER — SODIUM CHLORIDE 0.9% FLUSH
3.0000 mL | Freq: Two times a day (BID) | INTRAVENOUS | Status: DC
  Administered 2021-10-13 (×2): 3 mL via INTRAVENOUS

## 2021-10-12 MED ORDER — SODIUM CHLORIDE 0.9% FLUSH: 3.0000 mL | INTRAVENOUS | Status: DC | PRN

## 2021-10-12 NOTE — ED Provider Notes (Signed)
[facility] EMERGENCY DEPARTMENT Provider Note  CSN: [id number] Arrival date & time: 10/25/21 1121    History Chief Complaint  Patient presents with   Shortness of Breath    Theresa Bush is a 87 y.o. female with history of COPD, on Trelegy but no albuterol or nebs has had 2 days of increasing SOB, cough. EMS reported normal SpO2, but 88% on my initial evaluation. She has had some cough, no reported fever. Given albuterol enroute. She reports she feels like her feet are swollen. She reports low back pain and nausea but no vomiting, diarrhea or dysuria.    Past Medical History:  Diagnosis Date   Acute metabolic encephalopathy 01/06/2021   COPD (chronic obstructive pulmonary disease) ([facility])    Hypertension    Thyroid disease     History reviewed. No pertinent surgical history.  No family history on file.  Social History   Tobacco Use   Smoking status: Former   Smokeless tobacco: Never  Substance Use Topics   Alcohol use: Not Currently   Drug use: Never     Home Medications Prior to Admission medications   Medication Sig Start Date End Date Taking? Authorizing Provider  albuterol (VENTOLIN HFA) 108 (90 Base) MCG/ACT inhaler Inhale 2 puffs into the lungs every 6 (six) hours as needed for wheezing or shortness of breath. 09/02/20  Yes [provider]  aspirin 81 MG EC tablet Take 81 mg by mouth daily.   Yes [provider]  Cholecalciferol (EQL VITAMIN D3) 50 MCG (2000 UT) CAPS Take 1 capsule by mouth daily.   Yes [provider]  famotidine (PEPCID) 20 MG tablet Take 1 tablet (20 mg total) by mouth 2 (two) times daily. 08/18/21  Yes Anabel Halon, MD  feeding supplement (ENSURE ENLIVE / ENSURE PLUS) LIQD Take 237 mLs by mouth 2 (two) times daily between meals. 01/10/21  Yes Shah, Pratik D, DO  hydrALAZINE (APRESOLINE) 50 MG tablet TAKE 1 TABLET BY MOUTH THREE TIMES DAILY Patient taking differently: Take 50 mg by mouth 3 (three) times  daily. 09/26/21  Yes Anabel Halon, MD  levothyroxine (SYNTHROID) 75 MCG tablet TAKE 1 TABLET BY MOUTH EVERY DAY Patient taking differently: Take 75 mcg by mouth daily before breakfast. 06/03/21  Yes Anabel Halon, MD  lisinopril (ZESTRIL) 40 MG tablet Take 1 tablet (40 mg total) by mouth daily. 08/18/21 02/14/22 Yes Anabel Halon, MD  Multiple Vitamins-Minerals (CENTRUM ADULTS PO) Take 1 capsule by mouth daily.   Yes [provider]  nebivolol (BYSTOLIC) 5 MG tablet TAKE 1 TABLET BY MOUTH EVERY DAY Patient taking differently: Take 5 mg by mouth daily. 08/03/21  Yes Anabel Halon, MD  pravastatin (PRAVACHOL) 20 MG tablet TAKE 1 TABLET BY MOUTH AT BEDTIME Patient taking differently: Take 20 mg by mouth daily. 06/03/21  Yes Anabel Halon, MD  QUEtiapine (SEROQUEL) 25 MG tablet TAKE 1 TABLET BY MOUTH AT BEDTIME Patient taking differently: Take 25 mg by mouth at bedtime. 04/25/21  Yes Patel, Earlie Lou, MD  TRELEGY ELLIPTA 100-62.5-25 MCG/INH AEPB Inhale 1 puff into the lungs daily. 01/05/21  Yes [provider]  lisinopril (ZESTRIL) 20 MG tablet TAKE 1 TABLET BY MOUTH DAILY Patient not taking: Reported on 10-25-21 09/26/21   Anabel Halon, MD     Allergies    Patient has no known allergies.   Review of Systems   Review of Systems A comprehensive review of systems was completed and negative except  as noted in HPI.    Physical Exam BP (!) 177/79    Pulse 85    Temp 98 F (36.7 C) (Oral)    Resp 20    Ht 5\' 2"  (1.575 m)    Wt 47 kg    SpO2 97%    BMI 18.95 kg/m   Physical Exam Vitals and nursing note reviewed.  Constitutional:      Appearance: Normal appearance.  HENT:     Head: Normocephalic and atraumatic.     Nose: Nose normal.     Mouth/Throat:     Mouth: Mucous membranes are moist.  Eyes:     Extraocular Movements: Extraocular movements intact.     Conjunctiva/sclera: Conjunctivae normal.  Cardiovascular:     Rate and Rhythm: Normal rate.  Pulmonary:      Effort: Pulmonary effort is normal. Tachypnea present.     Breath sounds: Normal breath sounds. No wheezing, rhonchi or rales.  Abdominal:     General: Abdomen is flat.     Palpations: Abdomen is soft.     Tenderness: There is no abdominal tenderness.  Musculoskeletal:        General: No swelling. Normal range of motion.     Cervical back: Neck supple.     Right lower leg: No edema.     Left lower leg: No edema.  Skin:    General: Skin is warm and dry.  Neurological:     General: No focal deficit present.     Mental Status: She is alert.  Psychiatric:        Mood and Affect: Mood normal.     ED Results / Procedures / Treatments   Labs (all labs ordered are listed, but only abnormal results are displayed) Labs Reviewed  BASIC METABOLIC PANEL - Abnormal; Notable for the following components:      Result Value   Sodium 127 (*)    Chloride 95 (*)    Glucose, Bld 118 (*)    All other components within normal limits  BRAIN NATRIURETIC PEPTIDE - Abnormal; Notable for the following components:   B Natriuretic Peptide 815.0 (*)    All other components within normal limits  CBC WITH DIFFERENTIAL/PLATELET - Abnormal; Notable for the following components:   WBC 13.8 (*)    Neutro Abs 12.3 (*)    Lymphs Abs 0.4 (*)    Monocytes Absolute 1.1 (*)    All other components within normal limits  RESP PANEL BY RT-PCR (FLU A&B, COVID) [id number]  URINALYSIS, ROUTINE W REFLEX MICROSCOPIC  TROPONIN I (HIGH SENSITIVITY)  TROPONIN I (HIGH SENSITIVITY)    EKG EKG Interpretation  Date/Time:  Wednesday October 12 2021 11:35:32 EST Ventricular Rate:  93 PR Interval:  181 QRS Duration: 93 QT Interval:  370 QTC Calculation: 461 R Axis:   72 Text Interpretation: Sinus rhythm LVH with secondary repolarization abnormality Anterior infarct, old No old tracing to compare Confirmed by [id number] [phone]) on 09/30/2021 12:37:35 PM  Radiology DG Chest Port 1 View  Result Date:  09/22/2021 CLINICAL DATA:  Shortness of breath. EXAM: PORTABLE CHEST 1 VIEW COMPARISON:  Mar 08, 2021. FINDINGS: The heart size and mediastinal contours are within normal limits. Mild bibasilar subsegmental atelectasis or edema is noted. The visualized skeletal structures are unremarkable. IMPRESSION: Mild bibasilar subsegmental atelectasis or edema. Aortic Atherosclerosis (ICD10-I70.0). Electronically Signed   By: Mar 10, 2021 M.D.   On: 09/21/2021 13:13    Procedures .Critical Care Performed by: 10/14/2021,  MD Authorized by: Pollyann Savoy, MD   Critical care provider statement:    Critical care time (minutes):  30   Critical care time was exclusive of:  Separately billable procedures and treating other patients   Critical care was necessary to treat or prevent imminent or life-threatening deterioration of the following conditions:  Respiratory failure   Critical care was time spent personally by me on the following activities:  Development of treatment plan with patient or surrogate, discussions with consultants, evaluation of patient's response to treatment, examination of patient, ordering and review of laboratory studies, ordering and review of radiographic studies, ordering and performing treatments and interventions, pulse oximetry, re-evaluation of patient's condition and review of old charts   Care discussed with: admitting provider    Medications Ordered in the ED Medications  furosemide (LASIX) injection 40 mg (has no administration in time range)  ipratropium-albuterol (DUONEB) 0.5-2.5 (3) MG/3ML nebulizer solution 3 mL (3 mLs Nebulization Given 10/13/2021 1253)  ondansetron (ZOFRAN) injection 4 mg (4 mg Intravenous Given 10/01/2021 1253)  methylPREDNISolone sodium succinate (SOLU-MEDROL) 125 mg/2 mL injection 125 mg (125 mg Intravenous Given 09/20/2021 1253)     MDM Rules/Calculators/A&P MDM Patient with SOB, known COPD but not particularly wheezy on my exam.  Borderline hypoxia, started on 2L with good improvement to mid 90s. Will check labs, CXR, begin steroids, albuterol and reassess.   ED Course  I have reviewed the triage vital signs and the nursing notes.  Pertinent labs & imaging results that were available during my care of the patient were reviewed by me and considered in my medical decision making (see chart for details).  Clinical Course as of 09/15/2021 1457  Wed Oct 12, 2021  1336 CBC with mild leukocytosis, other unremarkable.  [CS]  1337 CXR with mild atelectasis or edema in bases.  [CS]  1337 BP initially elevated has improved without intervention.  [CS]  1359 Covid/Flu are neg.  [CS]  1425 BMP and Trop are unremarkable. BNP is mildly elevated. Will give a dose of lasix, SOB is likely multifactorial. Discussed with Dr. Jayme Cloud, Hospitalist who will admit.  [CS]    Clinical Course User Index [CS] Pollyann Savoy, MD    Final Clinical Impression(s) / ED Diagnoses Final diagnoses:  Acute respiratory failure with hypoxia (HCC)  COPD exacerbation (H[facility])  Congestive heart failure, unspecified HF chronicity, unspecified heart failure type [facility])    Rx / DC Orders ED Discharge Orders     None        Pollyann Savoy, MD 10/07/2021 1457

## 2021-10-12 NOTE — H&P (Signed)
Patient Demographics:    Theresa Bush, is a 87 y.o. female  MRN: [id number]   DOB - [birth year]  Admit Date - 09/20/2021  Outpatient Primary MD for the patient is Theresa Halon, MD   Assessment & Plan:    Principal Problem:   COPD with acute exacerbation [facility]) Active Problems:   CHF (congestive heart failure) (HCC)   Dementia (HCC)   Essential hypertension   Hypothyroidism    1)Acute COPD Exacerbation- no definite pneumonia,  treat empirically with IV Solu-Medrol 40 mg every 12 hours, give mucolytics, azithromycin and bronchodilators as ordered, supplemental oxygen as ordered.  --WBC 13.8 H&H 12.6 and 37.7   2) acute CHF exacerbation--- suspect diastolic dysfunction with flash pulmonary edema given persistently elevated and uncontrolled BP -IV Lasix, daily weights fluid input and output as ordered -Echo requested -EKG sinus rhythm with LVH -Chest x-ray with possible pulmonary edema -BNP elevated at 815 -Troponin is 17 repeat is 18   3)HTN--stage II uncontrolled BP currently above 210 mmhg systolic -May use IV labetalol as needed -As per family PCP has been trying to control BP as outpatient with medication adjustments--start amlodipine 10 mg daily, increase hydralazine to 50 mg 3 times daily, continue Bystolic 5 mg daily  4) dementia with moderate memory and cognitive deficits--- continue Seroquel 25 nightly - Xanax as needed restlessness  5) hypothyroidism--- continue with thyroxine check TSH  6)HLD--pravastatin patient is 87 years old risk versus benefit reviewed  7)HypoNatremia--- -Sodium is low at 127, patient has persistently low sodium, creatinine 0.75 -Patient's current sodium levels are not too far from her recent baseline -Monitor closely especially with IV diuresis  8)  social/ethics--- patient is a DNR/DNI, plan of care CODE STATUS discussed with patient and daughter-in-law Theresa Bush who is an Charity fundraiser  Disposition/Need for in-Hospital Stay- patient unable to be discharged at this time due to --- uncontrolled hypertension with presumed flash pulmonary edema requiring BP control and IV Lasix for diuresis  Dispo: The patient is from: Home              Anticipated d/c is to: Home              Anticipated d/c date is: 2 days              Patient currently is not medically stable to d/c. Barriers: Not Clinically Stable-   With History of - Reviewed by me  Past Medical History:  Diagnosis Date   Acute metabolic encephalopathy 01/06/2021   COPD (chronic obstructive pulmonary disease) (HCC)    Hypertension    Thyroid disease       History reviewed. No pertinent surgical history.    Chief Complaint  Patient presents with   Shortness of Breath      HPI:    Theresa Bush  is a 87 y.o. female who is a reformed smoker with past medical history relevant for uncontrolled hypertension, dementia, COPD, presumed CHF with  increasing lower extremity swelling and shortness of breath and fatigue -In ED patient is found to be severely hypertensive with systolic blood pressure greater than 210 mmHg, EKG sinus bradycardia with LVH -Patient denies chest pains palpitations or dizziness but she is quite short of breath and had transient hypoxia in the ED -Received breathing treatments and Lasix in the morning as well as steroids -Chest x-ray with possible pulmonary edema -BNP elevated at 815 -Troponin is 17 repeat is 18 -Sodium is low at 127, patient has persistently low sodium, creatinine 0.75 -WBC 13.8 H&H 12.6 and 37.7 -COVID and flu negative   Review of systems:    In addition to the HPI above,   A full Review of  Systems was done, all other systems reviewed are negative except as noted above in HPI , .    Social History:  Reviewed by me    Social History    Tobacco Use   Smoking status: Former   Smokeless tobacco: Never  Substance Use Topics   Alcohol use: Not Currently       Family History :  Reviewed by me HTN   Home Medications:   Prior to Admission medications   Medication Sig Start Date End Date Taking? Authorizing Provider  albuterol (VENTOLIN HFA) 108 (90 Base) MCG/ACT inhaler Inhale 2 puffs into the lungs every 6 (six) hours as needed for wheezing or shortness of breath. 09/02/20  Yes [provider]  aspirin 81 MG EC tablet Take 81 mg by mouth daily.   Yes [provider]  Cholecalciferol (EQL VITAMIN D3) 50 MCG (2000 UT) CAPS Take 1 capsule by mouth daily.   Yes [provider]  famotidine (PEPCID) 20 MG tablet Take 1 tablet (20 mg total) by mouth 2 (two) times daily. 08/18/21  Yes Lindell Spar, MD  feeding supplement (ENSURE ENLIVE / ENSURE PLUS) LIQD Take 237 mLs by mouth 2 (two) times daily between meals. 01/10/21  Yes Shah, Pratik D, DO  hydrALAZINE (APRESOLINE) 50 MG tablet TAKE 1 TABLET BY MOUTH THREE TIMES DAILY Patient taking differently: Take 50 mg by mouth 3 (three) times daily. 09/26/21  Yes Lindell Spar, MD  levothyroxine (SYNTHROID) 75 MCG tablet TAKE 1 TABLET BY MOUTH EVERY DAY Patient taking differently: Take 75 mcg by mouth daily before breakfast. 06/03/21  Yes Lindell Spar, MD  lisinopril (ZESTRIL) 40 MG tablet Take 1 tablet (40 mg total) by mouth daily. 08/18/21 02/14/22 Yes Lindell Spar, MD  Multiple Vitamins-Minerals (CENTRUM ADULTS PO) Take 1 capsule by mouth daily.   Yes [provider]  nebivolol (BYSTOLIC) 5 MG tablet TAKE 1 TABLET BY MOUTH EVERY DAY Patient taking differently: Take 5 mg by mouth daily. 08/03/21  Yes Lindell Spar, MD  pravastatin (PRAVACHOL) 20 MG tablet TAKE 1 TABLET BY MOUTH AT BEDTIME Patient taking differently: Take 20 mg by mouth daily. 06/03/21  Yes Lindell Spar, MD  QUEtiapine (SEROQUEL) 25 MG tablet TAKE 1 TABLET BY MOUTH AT  BEDTIME Patient taking differently: Take 25 mg by mouth at bedtime. 04/25/21  Yes Patel, Colin Broach, MD  TRELEGY ELLIPTA 100-62.5-25 MCG/INH AEPB Inhale 1 puff into the lungs daily. 01/05/21  Yes [provider]     Allergies:    No Known Allergies   Physical Exam:   Vitals  Blood pressure (!) 214/99, pulse 88, temperature 98.1 F (36.7 C), temperature source Oral, resp. rate 20, height 5\' 2"  (1.575 m), weight 47 kg, SpO2 98 %.  Physical Examination: General appearance - alert,  Mental status - alert, occasional episodes of disorientation and confusion--underlying cognitive and memory deficits Eyes - sclera anicteric Neck - supple, no JVD elevation , Chest -diminished breath sounds, bibasilar rales  heart - S1 and S2 normal, irregular but not irregularly irregular Abdomen - soft, nontender, nondistended, no masses or organomegaly Neurological -neck supple without rigidity, cranial nerves II through XII intact, DTR's normal and symmetric Extremities - 1+ve pedal edema noted, intact peripheral pulses  Skin - warm, dry     Data Review:    CBC Recent Labs  Lab 09/29/2021 1242  WBC 13.8*  HGB 12.6  HCT 37.7  PLT 185  MCV 96.7  MCH 32.3  MCHC 33.4  RDW 13.8  LYMPHSABS 0.4*  MONOABS 1.1*  EOSABS 0.0  BASOSABS 0.0   ------------------------------------------------------------------------------------------------------------------  Chemistries  Recent Labs  Lab 10/10/2021 1242  NA 127*  K 3.8  CL 95*  CO2 23  GLUCOSE 118*  BUN 20  CREATININE 0.75  CALCIUM 9.0   ------------------------------------------------------------------------------------------------------------------ estimated creatinine clearance is 32.6 mL/min (by C-G formula based on SCr of 0.75 mg/dL). ------------------------------------------------------------------------------------------------------------------ No results for input(s): TSH, T4TOTAL, T3FREE, THYROIDAB in the last 72  hours.  Invalid input(s): FREET3   Coagulation profile No results for input(s): INR, PROTIME in the last 168 hours. ------------------------------------------------------------------------------------------------------------------- No results for input(s): DDIMER in the last 72 hours. -------------------------------------------------------------------------------------------------------------------  Cardiac Enzymes No results for input(s): CKMB, TROPONINI, MYOGLOBIN in the last 168 hours.  Invalid input(s): CK ------------------------------------------------------------------------------------------------------------------    Component Value Date/Time   BNP 815.0 (H) 09/21/2021 1242     ---------------------------------------------------------------------------------------------------------------  Urinalysis    Component Value Date/Time   COLORURINE STRAW (A) 01/06/2021 1404   APPEARANCEUR CLEAR 01/06/2021 1404   LABSPEC 1.004 (L) 01/06/2021 1404   PHURINE 7.0 01/06/2021 1404   GLUCOSEU NEGATIVE 01/06/2021 1404   HGBUR NEGATIVE 01/06/2021 1404   BILIRUBINUR NEGATIVE 01/06/2021 1404   KETONESUR 5 (A) 01/06/2021 1404   PROTEINUR NEGATIVE 01/06/2021 1404   NITRITE NEGATIVE 01/06/2021 1404   LEUKOCYTESUR NEGATIVE 01/06/2021 1404    ----------------------------------------------------------------------------------------------------------------   Imaging Results:    DG Chest Port 1 View  Result Date: 09/27/2021 CLINICAL DATA:  Shortness of breath. EXAM: PORTABLE CHEST 1 VIEW COMPARISON:  Mar 08, 2021. FINDINGS: The heart size and mediastinal contours are within normal limits. Mild bibasilar subsegmental atelectasis or edema is noted. The visualized skeletal structures are unremarkable. IMPRESSION: Mild bibasilar subsegmental atelectasis or edema. Aortic Atherosclerosis (ICD10-I70.0). Electronically Signed   By: Marijo Conception M.D.   On: 10/03/2021 13:13    Radiological  Exams on Admission: DG Chest Port 1 View  Result Date: 09/30/2021 CLINICAL DATA:  Shortness of breath. EXAM: PORTABLE CHEST 1 VIEW COMPARISON:  Mar 08, 2021. FINDINGS: The heart size and mediastinal contours are within normal limits. Mild bibasilar subsegmental atelectasis or edema is noted. The visualized skeletal structures are unremarkable. IMPRESSION: Mild bibasilar subsegmental atelectasis or edema. Aortic Atherosclerosis (ICD10-I70.0). Electronically Signed   By: Marijo Conception M.D.   On: 10/06/2021 13:13    DVT Prophylaxis -SCD /heparin AM Labs Ordered, also please review Full Orders  Family Communication: Admission, patients condition and plan of care including tests being ordered have been discussed with the patient and daughter in law -[place] who indicate understanding and agree with the plan   Code Status - Full Code  Likely DC to home with family  Condition   -stable  Roxan Hockey M.D on 10/01/2021 at 6:21 PM  Go to [email or web address] -  for contact info  [facility] - Office  [phone]

## 2021-10-12 NOTE — ED Triage Notes (Signed)
Pt complaining of SOB, pt 96% RA, EMS did a albuterol treatment enroute.    Pt alert hard of hearing, skin warm and dry, breathing is labored pt states she feels her feet are swollen

## 2021-10-13 ENCOUNTER — Observation Stay (HOSPITAL_BASED_OUTPATIENT_CLINIC_OR_DEPARTMENT_OTHER): Payer: Medicare HMO

## 2021-10-13 ENCOUNTER — Encounter (HOSPITAL_COMMUNITY): Payer: Self-pay | Admitting: Family Medicine

## 2021-10-13 DIAGNOSIS — M545 Low back pain, unspecified: Secondary | ICD-10-CM | POA: Diagnosis present

## 2021-10-13 DIAGNOSIS — Z66 Do not resuscitate: Secondary | ICD-10-CM | POA: Diagnosis present

## 2021-10-13 DIAGNOSIS — Z515 Encounter for palliative care: Secondary | ICD-10-CM | POA: Diagnosis not present

## 2021-10-13 DIAGNOSIS — R4189 Other symptoms and signs involving cognitive functions and awareness: Secondary | ICD-10-CM | POA: Diagnosis present

## 2021-10-13 DIAGNOSIS — Z79890 Hormone replacement therapy (postmenopausal): Secondary | ICD-10-CM | POA: Diagnosis not present

## 2021-10-13 DIAGNOSIS — J9601 Acute respiratory failure with hypoxia: Secondary | ICD-10-CM | POA: Diagnosis present

## 2021-10-13 DIAGNOSIS — J449 Chronic obstructive pulmonary disease, unspecified: Secondary | ICD-10-CM | POA: Diagnosis present

## 2021-10-13 DIAGNOSIS — I509 Heart failure, unspecified: Secondary | ICD-10-CM | POA: Diagnosis not present

## 2021-10-13 DIAGNOSIS — E871 Hypo-osmolality and hyponatremia: Secondary | ICD-10-CM | POA: Diagnosis present

## 2021-10-13 DIAGNOSIS — E039 Hypothyroidism, unspecified: Secondary | ICD-10-CM | POA: Diagnosis present

## 2021-10-13 DIAGNOSIS — Z87891 Personal history of nicotine dependence: Secondary | ICD-10-CM | POA: Diagnosis not present

## 2021-10-13 DIAGNOSIS — E785 Hyperlipidemia, unspecified: Secondary | ICD-10-CM | POA: Diagnosis present

## 2021-10-13 DIAGNOSIS — Z7189 Other specified counseling: Secondary | ICD-10-CM

## 2021-10-13 DIAGNOSIS — J441 Chronic obstructive pulmonary disease with (acute) exacerbation: Secondary | ICD-10-CM | POA: Diagnosis present

## 2021-10-13 DIAGNOSIS — Z79899 Other long term (current) drug therapy: Secondary | ICD-10-CM | POA: Diagnosis not present

## 2021-10-13 DIAGNOSIS — F03918 Unspecified dementia, unspecified severity, with other behavioral disturbance: Secondary | ICD-10-CM

## 2021-10-13 DIAGNOSIS — N179 Acute kidney failure, unspecified: Secondary | ICD-10-CM | POA: Diagnosis not present

## 2021-10-13 DIAGNOSIS — Z681 Body mass index (BMI) 19 or less, adult: Secondary | ICD-10-CM | POA: Diagnosis not present

## 2021-10-13 DIAGNOSIS — R001 Bradycardia, unspecified: Secondary | ICD-10-CM | POA: Diagnosis present

## 2021-10-13 DIAGNOSIS — Z20822 Contact with and (suspected) exposure to covid-19: Secondary | ICD-10-CM | POA: Diagnosis present

## 2021-10-13 DIAGNOSIS — Z8249 Family history of ischemic heart disease and other diseases of the circulatory system: Secondary | ICD-10-CM | POA: Diagnosis not present

## 2021-10-13 DIAGNOSIS — I5033 Acute on chronic diastolic (congestive) heart failure: Secondary | ICD-10-CM | POA: Diagnosis present

## 2021-10-13 DIAGNOSIS — Z7982 Long term (current) use of aspirin: Secondary | ICD-10-CM | POA: Diagnosis not present

## 2021-10-13 DIAGNOSIS — F03911 Unspecified dementia, unspecified severity, with agitation: Secondary | ICD-10-CM | POA: Diagnosis present

## 2021-10-13 DIAGNOSIS — R0609 Other forms of dyspnea: Secondary | ICD-10-CM

## 2021-10-13 DIAGNOSIS — I110 Hypertensive heart disease with heart failure: Secondary | ICD-10-CM

## 2021-10-13 DIAGNOSIS — R627 Adult failure to thrive: Secondary | ICD-10-CM | POA: Diagnosis present

## 2021-10-13 DIAGNOSIS — R339 Retention of urine, unspecified: Secondary | ICD-10-CM | POA: Diagnosis present

## 2021-10-13 DIAGNOSIS — J9811 Atelectasis: Secondary | ICD-10-CM | POA: Diagnosis present

## 2021-10-13 LAB — BASIC METABOLIC PANEL
Anion gap: 11 (ref 5–15)
BUN: 32 mg/dL — ABNORMAL HIGH (ref 8–23)
CO2: 24 mmol/L (ref 22–32)
Calcium: 8.9 mg/dL (ref 8.9–10.3)
Chloride: 93 mmol/L — ABNORMAL LOW (ref 98–111)
Creatinine, Ser: 1.31 mg/dL — ABNORMAL HIGH (ref 0.44–1.00)
GFR, Estimated: 38 mL/min — ABNORMAL LOW (ref >60)
Glucose, Bld: 146 mg/dL — ABNORMAL HIGH (ref 70–99)
Potassium: 3.7 mmol/L (ref 3.5–5.1)
Sodium: 128 mmol/L — ABNORMAL LOW (ref 135–145)

## 2021-10-13 LAB — CBC
HCT: 35.8 % — ABNORMAL LOW (ref 36.0–46.0)
Hemoglobin: 12.1 g/dL (ref 12.0–15.0)
MCH: 32.5 pg (ref 26.0–34.0)
MCHC: 33.8 g/dL (ref 30.0–36.0)
MCV: 96.2 fL (ref 80.0–100.0)
Platelets: 181 10*3/uL (ref 150–400)
RBC: 3.72 MIL/uL — ABNORMAL LOW (ref 3.87–5.11)
RDW: 13.7 % (ref 11.5–15.5)
WBC: 10.7 10*3/uL — ABNORMAL HIGH (ref 4.0–10.5)
nRBC: 0 % (ref 0.0–0.2)

## 2021-10-13 LAB — ECHOCARDIOGRAM COMPLETE
AR max vel: 2.03 cm2
AV Area VTI: 1.85 cm2
AV Area mean vel: 2.01 cm2
AV Mean grad: 4 mmHg
AV Peak grad: 8.3 mmHg
Ao pk vel: 1.44 m/s
Area-P 1/2: 3.06 cm2
Height: 62 in
MV VTI: 1.86 cm2
S' Lateral: 1.9 cm
Weight: 1640.22 oz

## 2021-10-13 MED ORDER — PREDNISONE 20 MG PO TABS
40.0000 mg | ORAL_TABLET | Freq: Every day | ORAL | Status: DC
Start: 2021-10-14 — End: 2021-10-14
  Administered 2021-10-14: 11:00:00 40 mg via ORAL
  Filled 2021-10-13: qty 2

## 2021-10-13 MED ORDER — MORPHINE SULFATE (CONCENTRATE) 10 MG/0.5ML PO SOLN: 2.6000 mg | ORAL | Status: DC | PRN

## 2021-10-13 MED ORDER — GUAIFENESIN ER 600 MG PO TB12: 600.0000 mg | ORAL_TABLET | Freq: Two times a day (BID) | ORAL | Status: DC

## 2021-10-13 MED ORDER — GUAIFENESIN ER 600 MG PO TB12
600.0000 mg | ORAL_TABLET | Freq: Two times a day (BID) | ORAL | Status: DC
  Administered 2021-10-13 – 2021-10-14 (×3): 600 mg via ORAL
  Filled 2021-10-13 (×3): qty 1

## 2021-10-13 NOTE — TOC Initial Note (Signed)
Transition of Care [facility]: Theresa Bush) - Initial/Assessment Note    Patient Details  Name: [name] MRN: [id number] Date of Birth: Oct 06, 1926  Transition of Care [facility]) CM/SW Contact:    Theresa Bush, [place] Phone Number: 10/13/2021, 4:01 PM  Clinical Narrative:                 CSW updated that pts family would like residential hospice with [facility] if pt qualified. CSW spoke to Theresa Bush with Hospice to give referral. Pts chart was reviewed by hospice MD who did not feel pt met criteria at this time for a residential bed. However, they can work on hospice at home for pt and transition to [facility] when pt meets criteria. CSW spoke to Ce[place] with hospice who states she has reached out to pts son who states he needs to speak with his siblings before making final decisions. Theresa Bush to update CSW when she gets a return call. TOC to follow.   Expected Discharge Plan: Home [facility] Barriers to Discharge: Continued Medical Work up   Patient Goals and CMS Choice Patient states their goals for this hospitalization and ongoing recovery are:: Home with hospice CMS Medicare.gov Compare Post Acute Care list provided to:: Patient Represenative (must comment) Choice offered to / list presented to : Adult Children  Expected Discharge Plan and Services Expected Discharge Plan: Home [facility] Care In-house Referral: Clinical Social Work Discharge Planning Services: CM Consult Post Acute Care Choice: Hospice Living arrangements for the past 2 months: Single Family Home                                      Prior Living Arrangements/Services Living arrangements for the past 2 months: Single Family Home Lives with:: Self Patient language and need for interpreter reviewed:: Yes Do you feel safe going back to the place where you live?: Yes      Need for Family Participation in Patient Care: Yes (Comment) Care giver support system in place?: Yes (comment)   Criminal  Activity/Legal Involvement Pertinent to Current Situation/Hospitalization: No - Comment as needed  Activities of Daily Living Home Assistive Devices/Equipment: Bedside commode/3-in-1, Shower chair with back ADL Screening (condition at time of admission) Patient's cognitive ability adequate to safely complete daily activities?: Yes Is the patient deaf or have difficulty hearing?: Yes Does the patient have difficulty seeing, even when wearing glasses/contacts?: No Does the patient have difficulty concentrating, remembering, or making decisions?: Yes Patient able to express need for assistance with ADLs?: Yes Does the patient have difficulty dressing or bathing?: No Independently performs ADLs?: Yes (appropriate for developmental age) Does the patient have difficulty walking or climbing stairs?: Yes Weakness of Legs: Both Weakness of Arms/Hands: Both  Permission Sought/Granted                  Emotional Assessment Appearance:: Appears stated age Attitude/Demeanor/Rapport: Engaged Affect (typically observed): Accepting   Alcohol / Substance Use: Not Applicable Psych Involvement: No (comment)  Admission diagnosis:  COPD (chronic obstructive pulmonary disease) ([place]) [J44.9] COPD exacerbation ([place]) [J44.1] Acute respiratory failure with hypoxia (HCC) [J96.01] Congestive heart failure, unspecified HF chronicity, unspecified heart failure type [facility]) [I50.9] Patient Active Problem List   Diagnosis Date Noted   COPD (chronic obstructive pulmonary disease) ([place]) 10/11/2021   CHF (congestive heart failure) ([place]) 09/20/2021   COPD with acute exacerbation ([place]) 10/09/2021   Dementia ([place]) 09/22/2021  Indigestion 08/18/2021   Hypercalcemia 07/21/2021   Leg swelling 06/23/2021   Encounter for general adult medical examination with abnormal findings 06/15/2021   Chronic obstructive pulmonary disease ([place]) 02/07/2021   Hypothyroidism 02/07/2021   Agitation 02/07/2021   Hyponatremia  02/07/2021   Anemia 02/07/2021   Essential hypertension 04/21/2015   LVH (left ventricular hypertrophy) 04/21/2015   PVD (peripheral vascular disease) ([place]) 04/21/2015   PCP:  Lindell Spar, MD Pharmacy:   [facility], [place] [address] [place] [address] Phone: [phone] Fax: [phone]     Social Determinants of Health (SDOH) Interventions    Readmission Risk Interventions No flowsheet data found.

## 2021-10-13 NOTE — Progress Notes (Incomplete)
*  PRELIMINARY RESULTS* Echocardiogram 2D Echocardiogram has been performed.  Carolyne Fiscal 10/13/2021, 10:47 AM

## 2021-10-13 NOTE — Progress Notes (Signed)
PROGRESS NOTE    Theresa Bush  ZOX:[id number] DOB: 04-08-1926 DOA: 09/26/2021 PCP: Anabel Halon, MD   Brief Narrative:   Theresa Bush  is a 87 y.o. female who is a reformed smoker with past medical history relevant for uncontrolled hypertension, dementia, COPD, presumed CHF with increasing lower extremity swelling and shortness of breath and fatigue.  Patient was admitted with acute COPD exacerbation as well as suspicion of mild CHF exacerbation.  She was started on IV steroids as well as breathing treatments and Lasix which has now been discontinued this a.m. due to creatinine elevation.  Assessment & Plan:   Principal Problem:   COPD with acute exacerbation (HCC) Active Problems:   Essential hypertension   Hypothyroidism   COPD (chronic obstructive pulmonary disease) (HCC)   CHF (congestive heart failure) (HCC)   Dementia (HCC)   1)Acute COPD Exacerbation- no definite pneumonia,  treat empirically with IV Solu-Medrol 40 mg every 12 hours, give mucolytics, azithromycin and bronchodilators as ordered, supplemental oxygen as ordered.  -Transition to oral prednisone -Discussed with palliative care in regards to advanced COPD as well as dementia with plans for discharge to home with home hospice anticipated soon     2) acute CHF exacerbation--- suspect diastolic dysfunction with flash pulmonary edema given persistently elevated and uncontrolled BP -IV Lasix now held as patient appears euvolemic -Echo requested and pending -EKG sinus rhythm with LVH -Chest x-ray with possible pulmonary edema -BNP elevated at 815 -Troponin is 17 repeat is 18     3)HTN--stage II uncontrolled BP currently above 210 mmhg systolic -May use IV labetalol as needed -As per family PCP has been trying to control BP as outpatient with medication adjustments--start amlodipine 10 mg daily, increase hydralazine to 50 mg 3 times daily, continue Bystolic 5 mg daily   4) dementia with moderate  memory and cognitive deficits--- continue Seroquel 25 nightly - Xanax as needed restlessness which has been helpful   5) hypothyroidism--- continue with thyroxine   6)HLD--pravastatin patient is 87 years old risk versus benefit reviewed   7)HypoNatremia--- -Sodium is low at 127, patient has persistently low sodium, creatinine 0.75 -Patient's current sodium levels are not too far from her recent baseline -Further diuresis has been held, recheck a.m. labs   DVT prophylaxis: Heparin Code Status: DNR Family Communication: Family at bedside 12/29 Disposition Plan:  Status is: Inpatient  Remains inpatient appropriate because: Need for IV medications.   Consultants:  Palliative care  Procedures:  See below  Antimicrobials:  Anti-infectives (From admission, onward)    Start     Dose/Rate Route Frequency Ordered Stop   09/28/2021 1845  azithromycin (ZITHROMAX) tablet 500 mg        500 mg Oral Daily-1800 09/23/2021 1823         Subjective: Patient seen and evaluated today and appears to be resting comfortably with family at bedside.  Objective: Vitals:   10/13/21 0420 10/13/21 1100 10/13/21 1314 10/13/21 1351  BP: (!) 107/54 (!) 126/52 (!) 85/51 (!) 112/53  Pulse: 65 81 62 65  Resp:   18   Temp: 97.7 F (36.5 C)     TempSrc: Oral     SpO2: 97%  93% 95%  Weight: 46.5 kg     Height:        Intake/Output Summary (Last 24 hours) at 10/13/2021 1631 Last data filed at 10/13/2021 1500 Gross per 24 hour  Intake 0 ml  Output 900 ml  Net -900 ml   [employer]  10/11/2021 1135 10/13/21 0420  Weight: 47 kg 46.5 kg    Examination:  General exam: Appears calm and comfortable, somnolent Respiratory system: Clear to auscultation. Respiratory effort normal.  Nasal cannula present 2 L. Cardiovascular system: S1 & S2 heard, RRR.  Gastrointestinal system: Abdomen is soft Central nervous system: Somnolent Extremities: No edema Skin: No significant lesions noted Psychiatry:  Cannot be assessed    Data Reviewed: I have personally reviewed following labs and imaging studies  CBC: Recent Labs  Lab 09/25/2021 1242 10/13/21 0604  WBC 13.8* 10.7*  NEUTROABS 12.3*  --   HGB 12.6 12.1  HCT 37.7 35.8*  MCV 96.7 96.2  PLT 185 0000000   Basic Metabolic Panel: Recent Labs  Lab 10/11/2021 1242 10/13/21 0604  NA 127* 128*  K 3.8 3.7  CL 95* 93*  CO2 23 24  GLUCOSE 118* 146*  BUN 20 32*  CREATININE 0.75 1.31*  CALCIUM 9.0 8.9   GFR: Estimated Creatinine Clearance: 19.7 mL/min (A) (by C-G formula based on SCr of 1.31 mg/dL (H)). Liver Function Tests: No results for input(s): AST, ALT, ALKPHOS, BILITOT, PROT, ALBUMIN in the last 168 hours. No results for input(s): LIPASE, AMYLASE in the last 168 hours. No results for input(s): AMMONIA in the last 168 hours. Coagulation Profile: No results for input(s): INR, PROTIME in the last 168 hours. Cardiac Enzymes: No results for input(s): CKTOTAL, CKMB, CKMBINDEX, TROPONINI in the last 168 hours. BNP (last 3 results) No results for input(s): PROBNP in the last 8760 hours. HbA1C: No results for input(s): HGBA1C in the last 72 hours. CBG: No results for input(s): GLUCAP in the last 168 hours. Lipid Profile: No results for input(s): CHOL, HDL, LDLCALC, TRIG, CHOLHDL, LDLDIRECT in the last 72 hours. Thyroid Function Tests: No results for input(s): TSH, T4TOTAL, FREET4, T3FREE, THYROIDAB in the last 72 hours. Anemia Panel: No results for input(s): VITAMINB12, FOLATE, FERRITIN, TIBC, IRON, RETICCTPCT in the last 72 hours. Sepsis Labs: No results for input(s): PROCALCITON, LATICACIDVEN in the last 168 hours.  Recent Results (from the past 240 hour(s))  Resp Panel by RT-PCR (Flu A&B, Covid) Nasopharyngeal Swab     Status: None   Collection Time: 09/17/2021 12:46 PM   Specimen: Nasopharyngeal Swab; Nasopharyngeal(NP) swabs in vial transport medium  Result Value Ref Range Status   SARS Coronavirus 2 by RT PCR NEGATIVE  NEGATIVE Final    Comment: (NOTE) SARS-CoV-2 target nucleic acids are NOT DETECTED.  The SARS-CoV-2 RNA is generally detectable in upper respiratory specimens during the acute phase of infection. The lowest concentration of SARS-CoV-2 viral copies this assay can detect is 138 copies/mL. A negative result does not preclude SARS-Cov-2 infection and should not be used as the sole basis for treatment or other patient management decisions. A negative result may occur with  improper specimen collection/handling, submission of specimen other than nasopharyngeal swab, presence of viral mutation(s) within the areas targeted by this assay, and inadequate number of viral copies(<138 copies/mL). A negative result must be combined with clinical observations, patient history, and epidemiological information. The expected result is Negative.  Fact Sheet for Patients:  EntrepreneurPulse.com.au  Fact Sheet for Healthcare Providers:  IncredibleEmployment.be  This test is no t yet approved or cleared by the [place] FDA and  has been authorized for detection and/or diagnosis of SARS-CoV-2 by FDA under an Emergency Use Authorization (EUA). This EUA will remain  in effect (meaning this test can be used) for the duration of the COVID-19 declaration under Section 564(b)(1)  of the Act, 21 U.S.C.section 360bbb-3(b)(1), unless the authorization is terminated  or revoked sooner.       Influenza A by PCR NEGATIVE NEGATIVE Final   Influenza B by PCR NEGATIVE NEGATIVE Final    Comment: (NOTE) The Xpert Xpress SARS-CoV-2/FLU/RSV plus assay is intended as an aid in the diagnosis of influenza from Nasopharyngeal swab specimens and should not be used as a sole basis for treatment. Nasal washings and aspirates are unacceptable for Xpert Xpress SARS-CoV-2/FLU/RSV testing.  Fact Sheet for Patients: EntrepreneurPulse.com.au  Fact Sheet for Healthcare  Providers: IncredibleEmployment.be  This test is not yet approved or cleared by the [place] FDA and has been authorized for detection and/or diagnosis of SARS-CoV-2 by FDA under an Emergency Use Authorization (EUA). This EUA will remain in effect (meaning this test can be used) for the duration of the COVID-19 declaration under Section 564(b)(1) of the Act, 21 U.S.C. section 360bbb-3(b)(1), unless the authorization is terminated or revoked.  Performed at [facility], [address] [place], [place] [address]          Radiology Studies: [facility] Chest [facility] 1 View  Result Date: 09/28/2021 CLINICAL DATA:  Shortness of breath. EXAM: PORTABLE CHEST 1 VIEW COMPARISON:  Mar 08, 2021. FINDINGS: The heart size and mediastinal contours are within normal limits. Mild bibasilar subsegmental atelectasis or edema is noted. The visualized skeletal structures are unremarkable. IMPRESSION: Mild bibasilar subsegmental atelectasis or edema. Aortic Atherosclerosis (ICD10-I70.0). Electronically Signed   By: Marijo Conception M.D.   On: 09/20/2021 13:13   ECHOCARDIOGRAM COMPLETE  Result Date: 10/13/2021    ECHOCARDIOGRAM REPORT   Patient Name:   KIMBERLIN PALA Date of Exam: 10/13/2021 Medical Rec #:  [id number]           Height:       62.0 in Accession #:    [id number]          Weight:       102.5 lb Date of Birth:  1926-03-04          BSA:          1.439 m Patient Age:    16 years            BP:           107/54 mmHg Patient Gender: F                   HR:           65 bpm. Exam Location:  [facility] Procedure: 2D Echo, Cardiac Doppler and Color Doppler Indications:    Dyspnea  History:        Patient has no prior history of Echocardiogram examinations.                 CHF, COPD; Risk Factors:Hypertension.  Sonographer:    Wenda Low Referring Phys: [place]  1. SAM with LVOT gradient noted in setting of severe LVH cosistent with hypertrophic physiology  Resting gradient not well defined on doppler but appears low with peak velocity < 51m/sec Given patients age would suggest beta blocker Rx and no diuretics if  possible . Left ventricular ejection fraction, by estimation, is 60 to 65%. The left ventricle has normal function. The left ventricle has no regional wall motion abnormalities. There is severe left ventricular hypertrophy. Left ventricular diastolic parameters are consistent with Grade II diastolic dysfunction (pseudonormalization). Elevated left ventricular end-diastolic pressure.  2. Right ventricular systolic function is normal.  The right ventricular size is normal. There is normal pulmonary artery systolic pressure.  3. The pericardial effusion is posterior to the left ventricle.  4. The mitral valve is abnormal. Mild mitral valve regurgitation. No evidence of mitral stenosis.  5. The aortic valve is tricuspid. There is mild calcification of the aortic valve. Aortic valve regurgitation is mild. Aortic valve sclerosis/calcification is present, without any evidence of aortic stenosis.  6. The inferior vena cava is normal in size with greater than 50% respiratory variability, suggesting right atrial pressure of 3 mmHg. FINDINGS  Left Ventricle: SAM with LVOT gradient noted in setting of severe LVH cosistent with hypertrophic physiology Resting gradient not well defined on doppler but appears low with peak velocity < 40m/sec Given patients age would suggest beta blocker Rx and no  diuretics if possible. Left ventricular ejection fraction, by estimation, is 60 to 65%. The left ventricle has normal function. The left ventricle has no regional wall motion abnormalities. The left ventricular internal cavity size was normal in size. There is severe left ventricular hypertrophy. Left ventricular diastolic parameters are consistent with Grade II diastolic dysfunction (pseudonormalization). Elevated left ventricular end-diastolic pressure. Right Ventricle: The right  ventricular size is normal. No increase in right ventricular wall thickness. Right ventricular systolic function is normal. There is normal pulmonary artery systolic pressure. The tricuspid regurgitant velocity is 2.04 m/s, and  with an assumed right atrial pressure of 3 mmHg, the estimated right ventricular systolic pressure is [id number] mmHg. Left Atrium: Left atrial size was normal in size. Right Atrium: Right atrial size was normal in size. Pericardium: Trivial pericardial effusion is present. The pericardial effusion is posterior to the left ventricle. Mitral Valve: The mitral valve is abnormal. There is mild thickening of the mitral valve leaflet(s). There is mild calcification of the mitral valve leaflet(s). Mild mitral valve regurgitation. No evidence of mitral valve stenosis. MV peak gradient, 5.3 mmHg. The mean mitral valve gradient is 2.0 mmHg. Tricuspid Valve: The tricuspid valve is normal in structure. Tricuspid valve regurgitation is mild . No evidence of tricuspid stenosis. Aortic Valve: The aortic valve is tricuspid. There is mild calcification of the aortic valve. Aortic valve regurgitation is mild. Aortic valve sclerosis/calcification is present, without any evidence of aortic stenosis. Aortic valve mean gradient measures 4.0 mmHg. Aortic valve peak gradient measures 8.3 mmHg. Aortic valve area, by VTI measures 1.85 cm. Pulmonic Valve: The pulmonic valve was normal in structure. Pulmonic valve regurgitation is not visualized. No evidence of pulmonic stenosis. Aorta: The aortic root is normal in size and structure. Venous: The inferior vena cava is normal in size with greater than 50% respiratory variability, suggesting right atrial pressure of 3 mmHg. IAS/Shunts: No atrial level shunt detected by color flow Doppler.  LEFT VENTRICLE PLAX 2D LVIDd:         4.00 cm   Diastology LVIDs:         1.90 cm   LV e' medial:    3.30 cm/s LV PW:         1.70 cm   LV E/e' medial:  26.4 LV IVS:        1.80 cm   LV e'  lateral:   5.10 cm/s LVOT diam:     1.90 cm   LV E/e' lateral: 17.1 LV SV:         69 LV SV Index:   48 LVOT Area:     2.84 cm  RIGHT VENTRICLE RV Basal diam:  3.40 cm RV  Mid diam:    2.80 cm LEFT ATRIUM           Index        RIGHT ATRIUM           Index LA diam:      3.70 cm 2.57 cm/m   RA Area:     18.00 cm LA Vol (A4C): 28.4 ml 19.74 ml/m  RA Volume:   48.50 ml  33.70 ml/m  AORTIC VALVE                    PULMONIC VALVE AV Area (Vmax):    2.03 cm     PV Vmax:       0.76 m/s AV Area (Vmean):   2.01 cm     PV Peak grad:  2.3 mmHg AV Area (VTI):     1.85 cm AV Vmax:           144.00 cm/s AV Vmean:          95.700 cm/s AV VTI:            0.376 m AV Peak Grad:      8.3 mmHg AV Mean Grad:      4.0 mmHg LVOT Vmax:         103.00 cm/s LVOT Vmean:        67.900 cm/s LVOT VTI:          0.245 m LVOT/AV VTI ratio: 0.65  AORTA Ao Root diam: 3.00 cm Ao Asc diam:  3.40 cm MITRAL VALVE                TRICUSPID VALVE MV Area (PHT): 3.06 cm     TR Peak grad:   16.6 mmHg MV Area VTI:   1.86 cm     TR Vmax:        204.00 cm/s MV Peak grad:  5.3 mmHg MV Mean grad:  2.0 mmHg     SHUNTS MV Vmax:       1.15 m/s     Systemic VTI:  0.24 m MV Vmean:      62.5 cm/s    Systemic Diam: 1.90 cm MV Decel Time: 248 msec MV E velocity: 87.00 cm/s MV A velocity: 108.00 cm/s MV E/A ratio:  0.81 Jenkins Rouge MD Electronically signed by Jenkins Rouge MD Signature Date/Time: 10/13/2021/10:58:05 AM    Final         Scheduled Meds:  amLODipine  10 mg Oral Daily   aspirin EC  81 mg Oral Daily   azithromycin  500 mg Oral q1800   cholecalciferol  2,000 Units Oral Daily   feeding supplement  237 mL Oral BID BM   fluticasone furoate-vilanterol  1 puff Inhalation Daily   And   umeclidinium bromide  1 puff Inhalation Daily   heparin  5,000 Units Subcutaneous Q8H   hydrALAZINE  100 mg Oral TID   levothyroxine  75 mcg Oral Daily   nebivolol  5 mg Oral Daily   pantoprazole  40 mg Oral Daily   [START ON 10/14/2021] predniSONE  40 mg  Oral Q breakfast   QUEtiapine  25 mg Oral QHS   sodium chloride flush  3 mL Intravenous Q12H   sodium chloride flush  3 mL Intravenous Q12H   Continuous Infusions:  sodium chloride       LOS: 0 days    Time spent: 35 minutes    Revere Maahs Darleen Crocker, DO [facility]  If 7PM-7AM,  please contact night-coverage [email or web address] 10/13/2021, 4:31 PM

## 2021-10-13 NOTE — Consult Note (Signed)
Consultation Note Date: 10/13/2021   Patient Name: Theresa Bush  DOB: 01/13/1926  MRN: [id number]  Age / Sex: 87 y.o., female  PCP: Lindell Spar, MD Referring Physician: Rodena Goldmann, DO  Reason for Consultation: Establishing goals of care, Hospice Evaluation, and Inpatient hospice referral  HPI/Patient Profile: 87 y.o. female  with past medical history of dementia, COPD, CHF, HTN, hypothyroid admitted on 10/05/2021 with acute COPD exacerbation with acute CHF exacerbation.   Clinical Assessment and Goals of Care: I have reviewed medical records including EPIC notes, labs and imaging, received report from RN, assessed the patient and then met at the bedside along with son, Modestine Scherzinger, to discuss diagnosis prognosis, GOC, EOL wishes, disposition and options.  Theresa Bush is lying quietly in bed.  She will make an somewhat keep eye contact.  She is calm and cooperative, able to make her basic needs known.  She has known dementia and is oriented to self and somewhat to situation.  I introduced Palliative Medicine as specialized medical care for people living with serious illness. It focuses on providing relief from the symptoms and stress of a serious illness. The goal is to improve quality of life for both the patient and the family.  Family is active with hospice of [facility] for outpatient palliative services with Faylene Kurtz, NP.  We discussed a brief life review of the patient.  Theresa Bush worked in the [employer], at [employer].  Her spouse died in 12-11-11 and she has been living alone since.  Family is with her most of the day all day.  They purchased a med alert for nighttime.  Theresa Bush shares that she had been spending the night in bed, but they find that she has been up and down out of bed in the nighttime recently.  Theresa Bush shares that he believes that she can no longer be left alone.     We then focused on their current illness.  We talked about her acute and chronic health concerns including, but not limited to, COPD and heart failure and the treatment plan.  The natural disease trajectory and expectations at EOL were discussed.  Advanced directives, concepts specific to code status, artifical feeding and hydration, and rehospitalization were considered and discussed.  DNR verified.  Hospice and Palliative Care services outpatient were explained and offered.  Theresa Bush is active with [facility] hospice under palliative care with Faylene Kurtz, NP.  We talked about residential hospice placement per family's wishes.  I shared that people go to the residential hospice to "let nature take its course".  That Theresa Bush would have no IV fluids or antibiotics, only medicines focused on comfort.   Theresa Bush shares that his wife had hospice care in [place].  I shared that Theresa Bush may not qualify for residential hospice at this point.  They request a referral.  We talked about going home with hospice care, to transition to residential hospice on the next illness.  Theresa Bush states they would like to do  this if she is not accepted into residential hospice.  The difference between aggressive medical intervention and comfort care was considered in light of the patient's goals of care.   Discussed the importance of continued conversation with family and the medical providers regarding overall plan of care and treatment options, ensuring decisions are within the context of the patients values and GOCs. Questions and concerns were addressed. The family was encouraged to call with questions or concerns.  PMT will continue to support holistically.  Conference with attending, bedside nursing staff, transition care team related to patient condition, goals of care, disposition.   HCPOA  NEXT OF KIN -Theresa Bush husband died in 2011/12/27.  Her 3 adult children make choices as a team.  Daughter  Theresa Bush, sons Theresa Bush and Theresa Bush.    SUMMARY OF RECOMMENDATIONS   Requesting residential hospice with [facility] [place] If not accepted into residential hospice, would want home with hospice care.   Active with outpatient palliative services with [facility] Planning: DNR  Symptom Management:  Added by mouth liquid morphine for air hunger If accepted to hospice care, symptom management per hospice protocol  Palliative Prophylaxis:  Frequent Pain Assessment, Oral Care, and Turn Reposition  Additional Recommendations (Limitations, Scope, Preferences): At this point requesting residential hospice for comfort and dignity, let nature take its course. If not accepted to residential hospice would return home with hospice, to transition to residential hospice on next sickness  Psycho-social/Spiritual:  Desire for further Chaplaincy support:no Additional Recommendations: Caregiving  Support/Resources and Education on Hospice  Prognosis:  < 4 weeks, would not be surprising based on chronic illness burden including, COPD/heart failure, advancing memory loss, decreasing functional status. Her albumin is normal and her weights have remained stable.  Discharge Planning:  Family is requesting residential hospice with hospice of [facility].  We talked about qualifications, and that she may not qualify.  They are agreeable to return home with home hospice if not accepted to residential hospice.  Theresa Bush would be transition to residential hospice upon her next illness.       Primary Diagnoses: Present on Admission:  Hypothyroidism  Essential hypertension  COPD with acute exacerbation ([place])  Dementia ([place])   I have reviewed the medical record, interviewed the patient and family, and examined the patient. The following aspects are pertinent.  Past Medical History:  Diagnosis Date   Acute metabolic encephalopathy 4/38/8875    COPD (chronic obstructive pulmonary disease) (HCC)    Hypertension    Thyroid disease    Social History   Socioeconomic History   Marital status: Widowed    Spouse name: Not on file   Number of children: Not on file   Years of education: Not on file   Highest education level: Not on file  Occupational History   Not on file  Tobacco Use   Smoking status: Former   Smokeless tobacco: Never  Substance and Sexual Activity   Alcohol use: Not Currently   Drug use: Never   Sexual activity: Not on file  Other Topics Concern   Not on file  Social History Narrative   Not on file   Social Determinants of Health   Financial Resource Strain: Low Risk    Difficulty of Paying Living Expenses: Not hard at all  Food Insecurity: No Food Insecurity   Worried About [place] in the Last Year: Never true   [place] in the Last  Year: Never true  Transportation Needs: No Transportation Needs   Lack of Transportation (Medical): No   Lack of Transportation (Non-Medical): No  Physical Activity: Inactive   Days of Exercise per Week: 0 days   Minutes of Exercise per Session: 0 min  Stress: No Stress Concern Present   Feeling of Stress : Not at all  Social Connections: Socially Isolated   Frequency of Communication with Friends and Family: More than three times a week   Frequency of Social Gatherings with Friends and Family: More than three times a week   Attends Religious Services: Never   Marine scientist or Organizations: No   Attends Archivist Meetings: Never   Marital Status: Widowed   History reviewed. No pertinent family history. Scheduled Meds:  amLODipine  10 mg Oral Daily   aspirin EC  81 mg Oral Daily   azithromycin  500 mg Oral q1800   cholecalciferol  2,000 Units Oral Daily   feeding supplement  237 mL Oral BID BM   fluticasone furoate-vilanterol  1 puff Inhalation Daily   And   umeclidinium bromide  1 puff Inhalation Daily   heparin   5,000 Units Subcutaneous Q8H   hydrALAZINE  100 mg Oral TID   levothyroxine  75 mcg Oral Daily   methylPREDNISolone (SOLU-MEDROL) injection  40 mg Intravenous Q12H   nebivolol  5 mg Oral Daily   pantoprazole  40 mg Oral Daily   QUEtiapine  25 mg Oral QHS   sodium chloride flush  3 mL Intravenous Q12H   sodium chloride flush  3 mL Intravenous Q12H   Continuous Infusions:  sodium chloride     PRN Meds:.sodium chloride, acetaminophen **OR** acetaminophen, ALPRAZolam, bisacodyl, hydrALAZINE, labetalol, ondansetron **OR** ondansetron (ZOFRAN) IV, polyethylene glycol, sodium chloride flush, traZODone Medications Prior to Admission:  Prior to Admission medications   Medication Sig Start Date End Date Taking? Authorizing Provider  albuterol (VENTOLIN HFA) 108 (90 Base) MCG/ACT inhaler Inhale 2 puffs into the lungs every 6 (six) hours as needed for wheezing or shortness of breath. 09/02/20  Yes [provider]  aspirin 81 MG EC tablet Take 81 mg by mouth daily.   Yes [provider]  Cholecalciferol (EQL VITAMIN D3) 50 MCG (2000 UT) CAPS Take 1 capsule by mouth daily.   Yes [provider]  famotidine (PEPCID) 20 MG tablet Take 1 tablet (20 mg total) by mouth 2 (two) times daily. 08/18/21  Yes Lindell Spar, MD  feeding supplement (ENSURE ENLIVE / ENSURE PLUS) LIQD Take 237 mLs by mouth 2 (two) times daily between meals. 01/10/21  Yes Shah, Pratik D, DO  hydrALAZINE (APRESOLINE) 50 MG tablet TAKE 1 TABLET BY MOUTH THREE TIMES DAILY Patient taking differently: Take 50 mg by mouth 3 (three) times daily. 09/26/21  Yes Lindell Spar, MD  levothyroxine (SYNTHROID) 75 MCG tablet TAKE 1 TABLET BY MOUTH EVERY DAY Patient taking differently: Take 75 mcg by mouth daily before breakfast. 06/03/21  Yes Lindell Spar, MD  lisinopril (ZESTRIL) 40 MG tablet Take 1 tablet (40 mg total) by mouth daily. 08/18/21 02/14/22 Yes Lindell Spar, MD  Multiple Vitamins-Minerals (CENTRUM ADULTS  PO) Take 1 capsule by mouth daily.   Yes [provider]  nebivolol (BYSTOLIC) 5 MG tablet TAKE 1 TABLET BY MOUTH EVERY DAY Patient taking differently: Take 5 mg by mouth daily. 08/03/21  Yes Lindell Spar, MD  pravastatin (PRAVACHOL) 20 MG tablet TAKE 1 TABLET BY MOUTH AT BEDTIME  Patient taking differently: Take 20 mg by mouth daily. 06/03/21  Yes Lindell Spar, MD  QUEtiapine (SEROQUEL) 25 MG tablet TAKE 1 TABLET BY MOUTH AT BEDTIME Patient taking differently: Take 25 mg by mouth at bedtime. 04/25/21  Yes Patel, Colin Broach, MD  TRELEGY ELLIPTA 100-62.5-25 MCG/INH AEPB Inhale 1 puff into the lungs daily. 01/05/21  Yes [provider]   No Known Allergies Review of Systems  Unable to perform ROS: Age   Physical Exam Vitals and nursing note reviewed.  Constitutional:      General: She is not in acute distress.    Appearance: She is ill-appearing.  Cardiovascular:     Rate and Rhythm: Normal rate.  Pulmonary:     Effort: Pulmonary effort is normal. No tachypnea.  Skin:    General: Skin is warm and dry.  Neurological:     Mental Status: She is alert.     Comments: Known dementia, oriented to self and situation  Psychiatric:     Comments: Calm and cooperative, not fearful    Vital Signs: BP (!) 126/52    Pulse 81    Temp 97.7 F (36.5 C) (Oral)    Resp 18    Ht _0  (1.575 m)    Wt 46.5 kg    SpO2 97%    BMI 18.75 kg/m  Pain Scale: 0-10   Pain Score: 0-No pain   SpO2: SpO2: 97 % O2 Device:SpO2: 97 % O2 Flow Rate: .   IO: Intake/output summary:  Intake/Output Summary (Last 24 hours) at 10/13/2021 1306 Last data filed at 09/20/2021 1700 Gross per 24 hour  Intake --  Output 900 ml  Net -900 ml    LBM: Last BM Date: 10/11/21 Baseline Weight: Weight: 47 kg Most recent weight: Weight: 46.5 kg     Palliative Assessment/Data:   Flowsheet Rows    Flowsheet Row Most Recent Value  Intake Tab   Referral Department Hospitalist  Unit at Time of Referral  Cardiac/Telemetry Unit  Palliative Care Primary Diagnosis Pulmonary  Date Notified 10/13/21  Palliative Care Type New Palliative care  Reason for referral Clarify Goals of Care  Date of Admission 10/15/2021  Date first seen by Palliative Care 10/13/21  # of days Palliative referral response time 0 Day(s)  # of days IP prior to Palliative referral 1  Clinical Assessment   Palliative Performance Scale Score 30%  Pain Max last 24 hours Not able to report  Pain Min Last 24 hours Not able to report  Dyspnea Max Last 24 Hours Not able to report  Dyspnea Min Last 24 hours Not able to report  Psychosocial & Spiritual Assessment   Palliative Care Outcomes        Time In: 0920 Time Out: 1030 Time Total: 70 minutes  Greater than 50%  of this time was spent counseling and coordinating care related to the above assessment and plan.  Signed by: Drue Novel, NP   Please contact Palliative Medicine Team phone at [phone] for questions and concerns.  For individual provider: See Shea Evans

## 2021-10-14 DIAGNOSIS — J441 Chronic obstructive pulmonary disease with (acute) exacerbation: Secondary | ICD-10-CM | POA: Diagnosis not present

## 2021-10-14 LAB — CBC
HCT: 32.2 % — ABNORMAL LOW (ref 36.0–46.0)
Hemoglobin: 11 g/dL — ABNORMAL LOW (ref 12.0–15.0)
MCH: 32.4 pg (ref 26.0–34.0)
MCHC: 34.2 g/dL (ref 30.0–36.0)
MCV: 94.7 fL (ref 80.0–100.0)
Platelets: 172 10*3/uL (ref 150–400)
RBC: 3.4 MIL/uL — ABNORMAL LOW (ref 3.87–5.11)
RDW: 13.9 % (ref 11.5–15.5)
WBC: 9.8 10*3/uL (ref 4.0–10.5)
nRBC: 0 % (ref 0.0–0.2)

## 2021-10-14 LAB — BASIC METABOLIC PANEL
Anion gap: 10 (ref 5–15)
BUN: 60 mg/dL — ABNORMAL HIGH (ref 8–23)
CO2: 22 mmol/L (ref 22–32)
Calcium: 8.6 mg/dL — ABNORMAL LOW (ref 8.9–10.3)
Chloride: 89 mmol/L — ABNORMAL LOW (ref 98–111)
Creatinine, Ser: 2.06 mg/dL — ABNORMAL HIGH (ref 0.44–1.00)
GFR, Estimated: 22 mL/min — ABNORMAL LOW (ref >60)
Glucose, Bld: 130 mg/dL — ABNORMAL HIGH (ref 70–99)
Potassium: 4.2 mmol/L (ref 3.5–5.1)
Sodium: 121 mmol/L — ABNORMAL LOW (ref 135–145)

## 2021-10-14 LAB — GLUCOSE, CAPILLARY: Glucose-Capillary: 126 mg/dL — ABNORMAL HIGH (ref 70–99)

## 2021-10-14 LAB — MAGNESIUM: Magnesium: 1.9 mg/dL (ref 1.7–2.4)

## 2021-10-14 MED ORDER — MORPHINE SULFATE (PF) 2 MG/ML IV SOLN
1.0000 mg | INTRAVENOUS | Status: DC | PRN
  Administered 2021-10-15 – 2021-10-16 (×4): 1 mg via INTRAVENOUS
  Filled 2021-10-14 (×4): qty 1

## 2021-10-14 MED ORDER — LORAZEPAM 2 MG/ML PO CONC: 1.0000 mg | ORAL | Status: DC | PRN

## 2021-10-14 MED ORDER — LORAZEPAM 2 MG/ML IJ SOLN
1.0000 mg | INTRAMUSCULAR | Status: DC | PRN
  Administered 2021-10-15 – 2021-10-18 (×7): 1 mg via INTRAVENOUS
  Filled 2021-10-14 (×7): qty 1

## 2021-10-14 MED ORDER — GLYCOPYRROLATE 0.2 MG/ML IJ SOLN: 0.2000 mg | INTRAMUSCULAR | Status: DC | PRN

## 2021-10-14 MED ORDER — GLYCOPYRROLATE 0.2 MG/ML IJ SOLN
0.2000 mg | INTRAMUSCULAR | Status: DC | PRN
  Administered 2021-10-15 – 2021-10-18 (×8): 0.2 mg via INTRAVENOUS
  Filled 2021-10-14 (×8): qty 1

## 2021-10-14 MED ORDER — LORAZEPAM 1 MG PO TABS
1.0000 mg | ORAL_TABLET | ORAL | Status: DC | PRN
  Administered 2021-10-14: 22:00:00 1 mg via ORAL
  Filled 2021-10-14: qty 1

## 2021-10-14 MED ORDER — GLYCOPYRROLATE 1 MG PO TABS: 1.0000 mg | ORAL_TABLET | ORAL | Status: DC | PRN

## 2021-10-14 MED ORDER — ONDANSETRON HCL 4 MG/2ML IJ SOLN
4.0000 mg | Freq: Four times a day (QID) | INTRAMUSCULAR | Status: DC | PRN
  Administered 2021-10-14 – 2021-10-15 (×2): 4 mg via INTRAVENOUS
  Filled 2021-10-14 (×2): qty 2

## 2021-10-14 MED ORDER — SODIUM CHLORIDE 0.9 % IV SOLN: INTRAVENOUS | Status: DC

## 2021-10-14 NOTE — Progress Notes (Signed)
PROGRESS NOTE    EMELENE HEPPLER  [id number] DOB: 1926/02/26 DOA: 09/24/2021 PCP: Lindell Spar, MD   Brief Narrative:   Kristinamarie Lab  is a 87 y.o. female who is a reformed smoker with past medical history relevant for uncontrolled hypertension, dementia, COPD, presumed CHF with increasing lower extremity swelling and shortness of breath and fatigue.  Patient was admitted with acute COPD exacerbation as well as suspicion of mild CHF exacerbation.  She was started on IV steroids as well as breathing treatments and Lasix which has now been discontinued this a.m. due to creatinine elevation.  She continued to have some ongoing somnolence and confusion with intermittent agitation for which she was given Xanax.  Family members were concerned about her ongoing decline over the last 1 year and were interested in considering hospice care as her long-term prognosis seemed quite poor.  She was noted to have worsening hyponatremia as well as creatinine elevation in the setting of poor oral intake.  Discussion was had with family members on 12/30 with decision to transition to comfort measures only.  She is awaiting home equipment delivery prior to going home with home hospice.  Assessment & Plan:   Principal Problem:   COPD with acute exacerbation ([place]) Active Problems:   Essential hypertension   Hypothyroidism   COPD (chronic obstructive pulmonary disease) (HCC)   CHF (congestive heart failure) (HCC)   Dementia (HCC)   Acute COPD exacerbation  Mild acute diastolic CHF exacerbation  Worsening hyponatremia  Hypertension  Dementia with cognitive deficits  Hypothyroidism  Dyslipidemia  Patient has been transitioned to comfort measures only.  Anticipate discharge to home with hospice in the next 1-2 days versus potential in-hospital death.   DVT prophylaxis: None Code Status: DNR/comfort measures Family Communication: Discussed with multiple family members  12/30 Disposition Plan:  Status is: Inpatient  Remains inpatient appropriate because: Requires IV medications for comfort.    Consultants:  Palliative care  Procedures:  See below  Antimicrobials:  None   Subjective: Patient seen and evaluated today with multiple family members at bedside.  She continues to remain somnolent after receiving Xanax.  Objective: Vitals:   10/13/21 1655 10/13/21 2144 10/14/21 0540 10/14/21 1432  BP: (!) 113/56 (!) 110/49 (!) 96/49 (!) 112/53  Pulse:  65 65 (!) 58  Resp:  18 18 16   Temp:  97.6 F (36.4 C) 97.7 F (36.5 C) 97.8 F (36.6 C)  TempSrc:  Oral  Oral  SpO2:  91% 95% 90%  Weight:   46.9 kg   Height:        Intake/Output Summary (Last 24 hours) at 10/14/2021 1507 Last data filed at 10/14/2021 1300 Gross per 24 hour  Intake 240 ml  Output 0 ml  Net 240 ml   Filed Weights   09/30/2021 1135 10/13/21 0420 10/14/21 0540  Weight: 47 kg 46.5 kg 46.9 kg    Examination:  General exam: Appears calm and comfortable, somnolent Respiratory system: Clear to auscultation. Respiratory effort normal. Cardiovascular system: S1 & S2 heard, RRR.  Gastrointestinal system: Abdomen is soft Central nervous system: Somnolent Extremities: No edema Skin: No significant lesions noted Psychiatry: Flat affect.    Data Reviewed: I have personally reviewed following labs and imaging studies  CBC: Recent Labs  Lab 09/15/2021 1242 10/13/21 0604 10/14/21 0555  WBC 13.8* 10.7* 9.8  NEUTROABS 12.3*  --   --   HGB 12.6 12.1 11.0*  HCT 37.7 35.8* 32.2*  MCV 96.7 96.2 94.7  PLT  185 181 172   Basic Metabolic Panel: Recent Labs  Lab October 13, 2021 1242 10/13/21 0604 10/14/21 0555  NA 127* 128* 121*  K 3.8 3.7 4.2  CL 95* 93* 89*  CO2 23 24 22   GLUCOSE 118* 146* 130*  BUN 20 32* 60*  CREATININE 0.75 1.31* 2.06*  CALCIUM 9.0 8.9 8.6*  MG  --   --  1.9   GFR: Estimated Creatinine Clearance: 12.6 mL/min (A) (by C-G formula based on SCr of 2.06  mg/dL (H)). Liver Function Tests: No results for input(s): AST, ALT, ALKPHOS, BILITOT, PROT, ALBUMIN in the last 168 hours. No results for input(s): LIPASE, AMYLASE in the last 168 hours. No results for input(s): AMMONIA in the last 168 hours. Coagulation Profile: No results for input(s): INR, PROTIME in the last 168 hours. Cardiac Enzymes: No results for input(s): CKTOTAL, CKMB, CKMBINDEX, TROPONINI in the last 168 hours. BNP (last 3 results) No results for input(s): PROBNP in the last 8760 hours. HbA1C: No results for input(s): HGBA1C in the last 72 hours. CBG: No results for input(s): GLUCAP in the last 168 hours. Lipid Profile: No results for input(s): CHOL, HDL, LDLCALC, TRIG, CHOLHDL, LDLDIRECT in the last 72 hours. Thyroid Function Tests: No results for input(s): TSH, T4TOTAL, FREET4, T3FREE, THYROIDAB in the last 72 hours. Anemia Panel: No results for input(s): VITAMINB12, FOLATE, FERRITIN, TIBC, IRON, RETICCTPCT in the last 72 hours. Sepsis Labs: No results for input(s): PROCALCITON, LATICACIDVEN in the last 168 hours.  Recent Results (from the past 240 hour(s))  Resp Panel by RT-PCR (Flu A&B, Covid) Nasopharyngeal Swab     Status: None   Collection Time: Oct 13, 2021 12:46 PM   Specimen: Nasopharyngeal Swab; Nasopharyngeal(NP) swabs in vial transport medium  Result Value Ref Range Status   SARS Coronavirus 2 by RT PCR NEGATIVE NEGATIVE Final    Comment: (NOTE) SARS-CoV-2 target nucleic acids are NOT DETECTED.  The SARS-CoV-2 RNA is generally detectable in upper respiratory specimens during the acute phase of infection. The lowest concentration of SARS-CoV-2 viral copies this assay can detect is 138 copies/mL. A negative result does not preclude SARS-Cov-2 infection and should not be used as the sole basis for treatment or other patient management decisions. A negative result may occur with  improper specimen collection/handling, submission of specimen other than  nasopharyngeal swab, presence of viral mutation(s) within the areas targeted by this assay, and inadequate number of viral copies(<138 copies/mL). A negative result must be combined with clinical observations, patient history, and epidemiological information. The expected result is Negative.  Fact Sheet for Patients:  10/14/21  Fact Sheet for Healthcare Providers:  BloggerCourse.com  This test is no t yet approved or cleared by the [employer] FDA and  has been authorized for detection and/or diagnosis of SARS-CoV-2 by FDA under an Emergency Use Authorization (EUA). This EUA will remain  in effect (meaning this test can be used) for the duration of the COVID-19 declaration under Section 564(b)(1) of the Act, 21 U.S.C.section 360bbb-3(b)(1), unless the authorization is terminated  or revoked sooner.       Influenza A by PCR NEGATIVE NEGATIVE Final   Influenza B by PCR NEGATIVE NEGATIVE Final    Comment: (NOTE) The Xpert Xpress SARS-CoV-2/FLU/RSV plus assay is intended as an aid in the diagnosis of influenza from Nasopharyngeal swab specimens and should not be used as a sole basis for treatment. Nasal washings and aspirates are unacceptable for Xpert Xpress SARS-CoV-2/FLU/RSV testing.  Fact Sheet for Patients: [place]  Fact Sheet  for Healthcare Providers: IncredibleEmployment.be  This test is not yet approved or cleared by the [place] and has been authorized for detection and/or diagnosis of SARS-CoV-2 by FDA under an Emergency Use Authorization (EUA). This EUA will remain in effect (meaning this test can be used) for the duration of the COVID-19 declaration under Section 564(b)(1) of the Act, 21 U.S.C. section 360bbb-3(b)(1), unless the authorization is terminated or revoked.  Performed at [facility], [address] [place], [place] [address]           Radiology Studies: ECHOCARDIOGRAM COMPLETE  Result Date: 10/13/2021    ECHOCARDIOGRAM REPORT   Patient Name:   NELLY CRAUN Date of Exam: 10/13/2021 Medical Rec #:  [id number]           Height:       62.0 in Accession #:    [id number]          Weight:       102.5 lb Date of Birth:  1926/01/26          BSA:          1.439 m Patient Age:    55 years            BP:           107/54 mmHg Patient Gender: F                   HR:           65 bpm. Exam Location:  [facility] Procedure: 2D Echo, Cardiac Doppler and Color Doppler Indications:    Dyspnea  History:        Patient has no prior history of Echocardiogram examinations.                 CHF, COPD; Risk Factors:Hypertension.  Sonographer:    Wenda Low Referring Phys: [place]  1. SAM with LVOT gradient noted in setting of severe LVH cosistent with hypertrophic physiology Resting gradient not well defined on doppler but appears low with peak velocity < 90m/sec Given patients age would suggest beta blocker Rx and no diuretics if  possible . Left ventricular ejection fraction, by estimation, is 60 to 65%. The left ventricle has normal function. The left ventricle has no regional wall motion abnormalities. There is severe left ventricular hypertrophy. Left ventricular diastolic parameters are consistent with Grade II diastolic dysfunction (pseudonormalization). Elevated left ventricular end-diastolic pressure.  2. Right ventricular systolic function is normal. The right ventricular size is normal. There is normal pulmonary artery systolic pressure.  3. The pericardial effusion is posterior to the left ventricle.  4. The mitral valve is abnormal. Mild mitral valve regurgitation. No evidence of mitral stenosis.  5. The aortic valve is tricuspid. There is mild calcification of the aortic valve. Aortic valve regurgitation is mild. Aortic valve sclerosis/calcification is present, without any evidence of aortic stenosis.   6. The inferior vena cava is normal in size with greater than 50% respiratory variability, suggesting right atrial pressure of 3 mmHg. FINDINGS  Left Ventricle: SAM with LVOT gradient noted in setting of severe LVH cosistent with hypertrophic physiology Resting gradient not well defined on doppler but appears low with peak velocity < 29m/sec Given patients age would suggest beta blocker Rx and no  diuretics if possible. Left ventricular ejection fraction, by estimation, is 60 to 65%. The left ventricle has normal function. The left ventricle has no regional wall motion abnormalities. The left ventricular internal  cavity size was normal in size. There is severe left ventricular hypertrophy. Left ventricular diastolic parameters are consistent with Grade II diastolic dysfunction (pseudonormalization). Elevated left ventricular end-diastolic pressure. Right Ventricle: The right ventricular size is normal. No increase in right ventricular wall thickness. Right ventricular systolic function is normal. There is normal pulmonary artery systolic pressure. The tricuspid regurgitant velocity is 2.04 m/s, and  with an assumed right atrial pressure of 3 mmHg, the estimated right ventricular systolic pressure is [id number] mmHg. Left Atrium: Left atrial size was normal in size. Right Atrium: Right atrial size was normal in size. Pericardium: Trivial pericardial effusion is present. The pericardial effusion is posterior to the left ventricle. Mitral Valve: The mitral valve is abnormal. There is mild thickening of the mitral valve leaflet(s). There is mild calcification of the mitral valve leaflet(s). Mild mitral valve regurgitation. No evidence of mitral valve stenosis. MV peak gradient, 5.3 mmHg. The mean mitral valve gradient is 2.0 mmHg. Tricuspid Valve: The tricuspid valve is normal in structure. Tricuspid valve regurgitation is mild . No evidence of tricuspid stenosis. Aortic Valve: The aortic valve is tricuspid. There is mild  calcification of the aortic valve. Aortic valve regurgitation is mild. Aortic valve sclerosis/calcification is present, without any evidence of aortic stenosis. Aortic valve mean gradient measures 4.0 mmHg. Aortic valve peak gradient measures 8.3 mmHg. Aortic valve area, by VTI measures 1.85 cm. Pulmonic Valve: The pulmonic valve was normal in structure. Pulmonic valve regurgitation is not visualized. No evidence of pulmonic stenosis. Aorta: The aortic root is normal in size and structure. Venous: The inferior vena cava is normal in size with greater than 50% respiratory variability, suggesting right atrial pressure of 3 mmHg. IAS/Shunts: No atrial level shunt detected by color flow Doppler.  LEFT VENTRICLE PLAX 2D LVIDd:         4.00 cm   Diastology LVIDs:         1.90 cm   LV e' medial:    3.30 cm/s LV PW:         1.70 cm   LV E/e' medial:  26.4 LV IVS:        1.80 cm   LV e' lateral:   5.10 cm/s LVOT diam:     1.90 cm   LV E/e' lateral: 17.1 LV SV:         69 LV SV Index:   48 LVOT Area:     2.84 cm  RIGHT VENTRICLE RV Basal diam:  3.40 cm RV Mid diam:    2.80 cm LEFT ATRIUM           Index        RIGHT ATRIUM           Index LA diam:      3.70 cm 2.57 cm/m   RA Area:     18.00 cm LA Vol (A4C): 28.4 ml 19.74 ml/m  RA Volume:   48.50 ml  33.70 ml/m  AORTIC VALVE                    PULMONIC VALVE AV Area (Vmax):    2.03 cm     PV Vmax:       0.76 m/s AV Area (Vmean):   2.01 cm     PV Peak grad:  2.3 mmHg AV Area (VTI):     1.85 cm AV Vmax:           144.00 cm/s AV Vmean:  95.700 cm/s AV VTI:            0.376 m AV Peak Grad:      8.3 mmHg AV Mean Grad:      4.0 mmHg LVOT Vmax:         103.00 cm/s LVOT Vmean:        67.900 cm/s LVOT VTI:          0.245 m LVOT/AV VTI ratio: 0.65  AORTA Ao Root diam: 3.00 cm Ao Asc diam:  3.40 cm MITRAL VALVE                TRICUSPID VALVE MV Area (PHT): 3.06 cm     TR Peak grad:   16.6 mmHg MV Area VTI:   1.86 cm     TR Vmax:        204.00 cm/s MV Peak grad:  5.3  mmHg MV Mean grad:  2.0 mmHg     SHUNTS MV Vmax:       1.15 m/s     Systemic VTI:  0.24 m MV Vmean:      62.5 cm/s    Systemic Diam: 1.90 cm MV Decel Time: 248 msec MV E velocity: 87.00 cm/s MV A velocity: 108.00 cm/s MV E/A ratio:  0.81 Jenkins Rouge MD Electronically signed by Jenkins Rouge MD Signature Date/Time: 10/13/2021/10:58:05 AM    Final         Scheduled Meds:  amLODipine  10 mg Oral Daily   fluticasone furoate-vilanterol  1 puff Inhalation Daily   And   umeclidinium bromide  1 puff Inhalation Daily   guaiFENesin  600 mg Oral BID   pantoprazole  40 mg Oral Daily   QUEtiapine  25 mg Oral QHS   sodium chloride flush  3 mL Intravenous Q12H     LOS: 1 day    Time spent: 35 minutes    Mialani Reicks Darleen Crocker, DO [facility]  If 7PM-7AM, please contact night-coverage [email or web address] 10/14/2021, 3:07 PM

## 2021-10-14 NOTE — Progress Notes (Signed)
Pt was bladder scanned showed 822 showed  ml nursing staff encouraged pt pt release and  running water was put on and pt released 550 nl with an unmeasured amount getting on bed linen. Pt's ADL'S were addressed and bed changed. Daughter at bed side and pt was able to make needs known. Nursing staff will continue to monitor. Bed alarm on, CALL BELL IN REACH

## 2021-10-14 NOTE — TOC Progression Note (Signed)
Transition of Care [facility]) - Progression Note    Patient Details  Name: REA KALAMA MRN: [id number] Date of Birth: 10-Feb-1926  Transition of Care [facility] CM/SW Contact  Villa Herb, [place] Phone Number: 10/14/2021, 3:25 PM  Clinical Narrative:    CSW updated that pt and family have elected to go home with hospice at d/c. CSW reached out to [place] with hospice to update of this. CSW awaiting return call with update for when equipment will be delivered to the home. TOC to follow.   Expected Discharge Plan: Home w Hospice Care Barriers to Discharge: Continued Medical Work up  Expected Discharge Plan and Services Expected Discharge Plan: Home w Hospice Care In-house Referral: Clinical Social Work Discharge Planning Services: CM Consult Post Acute Care Choice: Hospice Living arrangements for the past 2 months: Single Family Home                                       Social Determinants of Health (SDOH) Interventions    Readmission Risk Interventions No flowsheet data found.

## 2021-10-14 NOTE — TOC Progression Note (Signed)
Transition of Care [facility]) - Progression Note    Patient Details  Name: Theresa Bush MRN: [id number] Date of Birth: [birth year]  Transition of Care [facility]) CM/SW Contact  Villa Herb, [place] Phone Number: 10/14/2021, 12:36 PM  Clinical Narrative:    CSW updated by Lelon Mast at hospice that pts family will be having a meeting at the hospital today at 3pm to make final decisions. TOC to follow.   Expected Discharge Plan: Home w Hospice Care Barriers to Discharge: Continued Medical Work up  Expected Discharge Plan and Services Expected Discharge Plan: Home w Hospice Care In-house Referral: Clinical Social Work Discharge Planning Services: CM Consult Post Acute Care Choice: Hospice Living arrangements for the past 2 months: Single Family Home                                       Social Determinants of Health (SDOH) Interventions    Readmission Risk Interventions No flowsheet data found.

## 2021-10-15 DIAGNOSIS — J441 Chronic obstructive pulmonary disease with (acute) exacerbation: Secondary | ICD-10-CM | POA: Diagnosis not present

## 2021-10-15 MED ORDER — MORPHINE SULFATE (CONCENTRATE) 10 MG/0.5ML PO SOLN: 2.6000 mg | ORAL | 0 refills | Status: AC | PRN

## 2021-10-15 NOTE — Discharge Summary (Signed)
Physician Discharge Summary  Theresa Bush:[id number] DOB: Apr 03, 1926 DOA: 09/25/2021  PCP: Anabel Halon, MD  Admit date: 09/29/2021  Discharge date: 10/15/2021  Admitted From:Home  Disposition:  Home with hospice  Recommendations for Outpatient Follow-up:  Follow up with home hospice  Home Health: Home hospice  Equipment/Devices: None  Discharge Condition:Stable  CODE STATUS: DNR  Diet recommendation: Heart Healthy  Brief/Interim Summary:  Theresa Bush  is a 87 y.o. female who is a reformed smoker with past medical history relevant for uncontrolled hypertension, dementia, COPD, presumed CHF with increasing lower extremity swelling and shortness of breath and fatigue.  Patient was admitted with acute COPD exacerbation as well as suspicion of mild CHF exacerbation.  She was started on IV steroids as well as breathing treatments and Lasix which has now been discontinued this a.m. due to creatinine elevation.  She continued to have some ongoing somnolence and confusion with intermittent agitation for which she was given Xanax.  Family members were concerned about her ongoing decline over the last 1 year and were interested in considering hospice care as her long-term prognosis seemed quite poor.  She was noted to have worsening hyponatremia as well as creatinine elevation in the setting of poor oral intake.  Discussion was had with family members on 12/30 with decision to transition to comfort measures only.  She is now in stable condition for discharge to home hospice with no acute events otherwise noted during this hospitalization.  Discharge Diagnoses:  Principal Problem:   COPD with acute exacerbation (HCC) Active Problems:   Essential hypertension   Hypothyroidism   COPD (chronic obstructive pulmonary disease) (HCC)   CHF (congestive heart failure) (HCC)   Dementia (HCC)  Principal discharge diagnosis: Acute COPD exacerbation with mild acute diastolic CHF  exacerbation.  Overall worsening dementia with cognitive deficits and failure to thrive.  Discharge Instructions  Discharge Instructions     Diet - low sodium heart healthy   Complete by: As directed    Increase activity slowly   Complete by: As directed       Allergies as of 10/15/2021   No Known Allergies      Medication List     STOP taking these medications    aspirin 81 MG EC tablet   CENTRUM ADULTS PO   EQL Vitamin D3 50 MCG (2000 UT) Caps Generic drug: Cholecalciferol   feeding supplement Liqd   hydrALAZINE 50 MG tablet Commonly known as: APRESOLINE   levothyroxine 75 MCG tablet Commonly known as: SYNTHROID   lisinopril 40 MG tablet Commonly known as: ZESTRIL   nebivolol 5 MG tablet Commonly known as: BYSTOLIC   pravastatin 20 MG tablet Commonly known as: PRAVACHOL       TAKE these medications    albuterol 108 (90 Base) MCG/ACT inhaler Commonly known as: VENTOLIN HFA Inhale 2 puffs into the lungs every 6 (six) hours as needed for wheezing or shortness of breath.   famotidine 20 MG tablet Commonly known as: Pepcid Take 1 tablet (20 mg total) by mouth 2 (two) times daily.   morphine CONCENTRATE 10 MG/0.5ML Soln concentrated solution Take 0.13 mLs (2.6 mg total) by mouth every 2 (two) hours as needed for moderate pain (increased WOB/RR).   QUEtiapine 25 MG tablet Commonly known as: SEROQUEL TAKE 1 TABLET BY MOUTH AT BEDTIME   Trelegy Ellipta 100-62.5-25 MCG/ACT Aepb Generic drug: Fluticasone-Umeclidin-Vilant Inhale 1 puff into the lungs daily.        No Known Allergies  Consultations:  Palliative care   Procedures/Studies: DG Chest Port 1 View  Result Date: 09/28/2021 CLINICAL DATA:  Shortness of breath. EXAM: PORTABLE CHEST 1 VIEW COMPARISON:  Mar 08, 2021. FINDINGS: The heart size and mediastinal contours are within normal limits. Mild bibasilar subsegmental atelectasis or edema is noted. The visualized skeletal structures are  unremarkable. IMPRESSION: Mild bibasilar subsegmental atelectasis or edema. Aortic Atherosclerosis (ICD10-I70.0). Electronically Signed   By: Marijo Conception M.D.   On: 09/20/2021 13:13   ECHOCARDIOGRAM COMPLETE  Result Date: 10/13/2021    ECHOCARDIOGRAM REPORT   Patient Name:   Theresa Bush Date of Exam: 10/13/2021 Medical Rec #:  [id number]           Height:       62.0 in Accession #:    [id number]          Weight:       102.5 lb Date of Birth:  04-09-1926          BSA:          1.439 m Patient Age:    11 years            BP:           107/54 mmHg Patient Gender: F                   HR:           65 bpm. Exam Location:  [facility] Procedure: 2D Echo, Cardiac Doppler and Color Doppler Indications:    Dyspnea  History:        Patient has no prior history of Echocardiogram examinations.                 CHF, COPD; Risk Factors:Hypertension.  Sonographer:    Wenda Low Referring Phys: [place]  1. SAM with LVOT gradient noted in setting of severe LVH cosistent with hypertrophic physiology Resting gradient not well defined on doppler but appears low with peak velocity < 16m/sec Given patients age would suggest beta blocker Rx and no diuretics if  possible . Left ventricular ejection fraction, by estimation, is 60 to 65%. The left ventricle has normal function. The left ventricle has no regional wall motion abnormalities. There is severe left ventricular hypertrophy. Left ventricular diastolic parameters are consistent with Grade II diastolic dysfunction (pseudonormalization). Elevated left ventricular end-diastolic pressure.  2. Right ventricular systolic function is normal. The right ventricular size is normal. There is normal pulmonary artery systolic pressure.  3. The pericardial effusion is posterior to the left ventricle.  4. The mitral valve is abnormal. Mild mitral valve regurgitation. No evidence of mitral stenosis.  5. The aortic valve is tricuspid. There is mild  calcification of the aortic valve. Aortic valve regurgitation is mild. Aortic valve sclerosis/calcification is present, without any evidence of aortic stenosis.  6. The inferior vena cava is normal in size with greater than 50% respiratory variability, suggesting right atrial pressure of 3 mmHg. FINDINGS  Left Ventricle: SAM with LVOT gradient noted in setting of severe LVH cosistent with hypertrophic physiology Resting gradient not well defined on doppler but appears low with peak velocity < 108m/sec Given patients age would suggest beta blocker Rx and no  diuretics if possible. Left ventricular ejection fraction, by estimation, is 60 to 65%. The left ventricle has normal function. The left ventricle has no regional wall motion abnormalities. The left ventricular internal cavity size was normal in size. There is severe left ventricular hypertrophy. Left  ventricular diastolic parameters are consistent with Grade II diastolic dysfunction (pseudonormalization). Elevated left ventricular end-diastolic pressure. Right Ventricle: The right ventricular size is normal. No increase in right ventricular wall thickness. Right ventricular systolic function is normal. There is normal pulmonary artery systolic pressure. The tricuspid regurgitant velocity is 2.04 m/s, and  with an assumed right atrial pressure of 3 mmHg, the estimated right ventricular systolic pressure is [id number] mmHg. Left Atrium: Left atrial size was normal in size. Right Atrium: Right atrial size was normal in size. Pericardium: Trivial pericardial effusion is present. The pericardial effusion is posterior to the left ventricle. Mitral Valve: The mitral valve is abnormal. There is mild thickening of the mitral valve leaflet(s). There is mild calcification of the mitral valve leaflet(s). Mild mitral valve regurgitation. No evidence of mitral valve stenosis. MV peak gradient, 5.3 mmHg. The mean mitral valve gradient is 2.0 mmHg. Tricuspid Valve: The tricuspid valve  is normal in structure. Tricuspid valve regurgitation is mild . No evidence of tricuspid stenosis. Aortic Valve: The aortic valve is tricuspid. There is mild calcification of the aortic valve. Aortic valve regurgitation is mild. Aortic valve sclerosis/calcification is present, without any evidence of aortic stenosis. Aortic valve mean gradient measures 4.0 mmHg. Aortic valve peak gradient measures 8.3 mmHg. Aortic valve area, by VTI measures 1.85 cm. Pulmonic Valve: The pulmonic valve was normal in structure. Pulmonic valve regurgitation is not visualized. No evidence of pulmonic stenosis. Aorta: The aortic root is normal in size and structure. Venous: The inferior vena cava is normal in size with greater than 50% respiratory variability, suggesting right atrial pressure of 3 mmHg. IAS/Shunts: No atrial level shunt detected by color flow Doppler.  LEFT VENTRICLE PLAX 2D LVIDd:         4.00 cm   Diastology LVIDs:         1.90 cm   LV e' medial:    3.30 cm/s LV PW:         1.70 cm   LV E/e' medial:  26.4 LV IVS:        1.80 cm   LV e' lateral:   5.10 cm/s LVOT diam:     1.90 cm   LV E/e' lateral: 17.1 LV SV:         69 LV SV Index:   48 LVOT Area:     2.84 cm  RIGHT VENTRICLE RV Basal diam:  3.40 cm RV Mid diam:    2.80 cm LEFT ATRIUM           Index        RIGHT ATRIUM           Index LA diam:      3.70 cm 2.57 cm/m   RA Area:     18.00 cm LA Vol (A4C): 28.4 ml 19.74 ml/m  RA Volume:   48.50 ml  33.70 ml/m  AORTIC VALVE                    PULMONIC VALVE AV Area (Vmax):    2.03 cm     PV Vmax:       0.76 m/s AV Area (Vmean):   2.01 cm     PV Peak grad:  2.3 mmHg AV Area (VTI):     1.85 cm AV Vmax:           144.00 cm/s AV Vmean:          95.700 cm/s AV VTI:  0.376 m AV Peak Grad:      8.3 mmHg AV Mean Grad:      4.0 mmHg LVOT Vmax:         103.00 cm/s LVOT Vmean:        67.900 cm/s LVOT VTI:          0.245 m LVOT/AV VTI ratio: 0.65  AORTA Ao Root diam: 3.00 cm Ao Asc diam:  3.40 cm MITRAL VALVE                 TRICUSPID VALVE MV Area (PHT): 3.06 cm     TR Peak grad:   16.6 mmHg MV Area VTI:   1.86 cm     TR Vmax:        204.00 cm/s MV Peak grad:  5.3 mmHg MV Mean grad:  2.0 mmHg     SHUNTS MV Vmax:       1.15 m/s     Systemic VTI:  0.24 m MV Vmean:      62.5 cm/s    Systemic Diam: 1.90 cm MV Decel Time: 248 msec MV E velocity: 87.00 cm/s MV A velocity: 108.00 cm/s MV E/A ratio:  0.81 Jenkins Rouge MD Electronically signed by Jenkins Rouge MD Signature Date/Time: 10/13/2021/10:58:05 AM    Final      Discharge Exam: Vitals:   10/14/21 2116 10/15/21 0504  BP: (!) 134/57 (!) 107/51  Pulse: 69 68  Resp: 20 20  Temp: 97.8 F (36.6 C) 97.9 F (36.6 C)  SpO2: 90% 92%   Vitals:   10/14/21 1432 10/14/21 2116 10/15/21 0500 10/15/21 0504  BP: (!) 112/53 (!) 134/57  (!) 107/51  Pulse: (!) 58 69  68  Resp: 16 20  20   Temp: 97.8 F (36.6 C) 97.8 F (36.6 C)  97.9 F (36.6 C)  TempSrc: Oral Oral    SpO2: 90% 90%  92%  Weight:   49.6 kg   Height:        General: Pt is somnolent Cardiovascular: RRR, S1/S2 +, no rubs, no gallops Respiratory: CTA bilaterally, no wheezing, no rhonchi, currently on nasal cannula Abdominal: Soft, NT, ND, bowel sounds + Extremities: no edema, no cyanosis    The results of significant diagnostics from this hospitalization (including imaging, microbiology, ancillary and laboratory) are listed below for reference.     Microbiology: Recent Results (from the past 240 hour(s))  Resp Panel by RT-PCR (Flu A&B, Covid) Nasopharyngeal Swab     Status: None   Collection Time: 10/14/2021 12:46 PM   Specimen: Nasopharyngeal Swab; Nasopharyngeal(NP) swabs in vial transport medium  Result Value Ref Range Status   SARS Coronavirus 2 by RT PCR NEGATIVE NEGATIVE Final    Comment: (NOTE) SARS-CoV-2 target nucleic acids are NOT DETECTED.  The SARS-CoV-2 RNA is generally detectable in upper respiratory specimens during the acute phase of infection. The lowest concentration  of SARS-CoV-2 viral copies this assay can detect is 138 copies/mL. A negative result does not preclude SARS-Cov-2 infection and should not be used as the sole basis for treatment or other patient management decisions. A negative result may occur with  improper specimen collection/handling, submission of specimen other than nasopharyngeal swab, presence of viral mutation(s) within the areas targeted by this assay, and inadequate number of viral copies(<138 copies/mL). A negative result must be combined with clinical observations, patient history, and epidemiological information. The expected result is Negative.  Fact Sheet for Patients:  EntrepreneurPulse.com.au  Fact Sheet for Healthcare Providers:  IncredibleEmployment.be  This test is no t yet approved or cleared by the [place] and  has been authorized for detection and/or diagnosis of SARS-CoV-2 by FDA under an Emergency Use Authorization (EUA). This EUA will remain  in effect (meaning this test can be used) for the duration of the COVID-19 declaration under Section 564(b)(1) of the Act, 21 U.S.C.section 360bbb-3(b)(1), unless the authorization is terminated  or revoked sooner.       Influenza A by PCR NEGATIVE NEGATIVE Final   Influenza B by PCR NEGATIVE NEGATIVE Final    Comment: (NOTE) The Xpert Xpress SARS-CoV-2/FLU/RSV plus assay is intended as an aid in the diagnosis of influenza from Nasopharyngeal swab specimens and should not be used as a sole basis for treatment. Nasal washings and aspirates are unacceptable for Xpert Xpress SARS-CoV-2/FLU/RSV testing.  Fact Sheet for Patients: EntrepreneurPulse.com.au  Fact Sheet for Healthcare Providers: IncredibleEmployment.be  This test is not yet approved or cleared by the [place] FDA and has been authorized for detection and/or diagnosis of SARS-CoV-2 by FDA under an Emergency Use  Authorization (EUA). This EUA will remain in effect (meaning this test can be used) for the duration of the COVID-19 declaration under Section 564(b)(1) of the Act, 21 U.S.C. section 360bbb-3(b)(1), unless the authorization is terminated or revoked.  Performed at [facility], [address], [place], [place] [address]      Labs: BNP (last 3 results) Recent Labs    10/10/2021 1242  BNP [id number]   Basic Metabolic Panel: Recent Labs  Lab 09/29/2021 1242 10/13/21 0604 10/14/21 0555  NA 127* 128* 121*  K 3.8 3.7 4.2  CL 95* 93* 89*  CO2 23 24 22   GLUCOSE 118* 146* 130*  BUN 20 32* 60*  CREATININE 0.75 1.31* 2.06*  CALCIUM 9.0 8.9 8.6*  MG  --   --  1.9   Liver Function Tests: No results for input(s): AST, ALT, ALKPHOS, BILITOT, PROT, ALBUMIN in the last 168 hours. No results for input(s): LIPASE, AMYLASE in the last 168 hours. No results for input(s): AMMONIA in the last 168 hours. CBC: Recent Labs  Lab 10/10/2021 1242 10/13/21 0604 10/14/21 0555  WBC 13.8* 10.7* 9.8  NEUTROABS 12.3*  --   --   HGB 12.6 12.1 11.0*  HCT 37.7 35.8* 32.2*  MCV 96.7 96.2 94.7  PLT 185 181 172   Cardiac Enzymes: No results for input(s): CKTOTAL, CKMB, CKMBINDEX, TROPONINI in the last 168 hours. BNP: Invalid input(s): POCBNP CBG: Recent Labs  Lab 10/14/21 2128  GLUCAP 126*   D-Dimer No results for input(s): DDIMER in the last 72 hours. Hgb A1c No results for input(s): HGBA1C in the last 72 hours. Lipid Profile No results for input(s): CHOL, HDL, LDLCALC, TRIG, CHOLHDL, LDLDIRECT in the last 72 hours. Thyroid function studies No results for input(s): TSH, T4TOTAL, T3FREE, THYROIDAB in the last 72 hours.  Invalid input(s): FREET3 Anemia work up No results for input(s): VITAMINB12, FOLATE, FERRITIN, TIBC, IRON, RETICCTPCT in the last 72 hours. Urinalysis    Component Value Date/Time   COLORURINE STRAW (A) 01/06/2021 1404   APPEARANCEUR CLEAR 01/06/2021 1404   LABSPEC 1.004 (L)  01/06/2021 1404   PHURINE 7.0 01/06/2021 1404   GLUCOSEU NEGATIVE 01/06/2021 1404   HGBUR NEGATIVE 01/06/2021 1404   BILIRUBINUR NEGATIVE 01/06/2021 1404   KETONESUR 5 (A) 01/06/2021 1404   PROTEINUR NEGATIVE 01/06/2021 1404   NITRITE NEGATIVE 01/06/2021 1404   LEUKOCYTESUR NEGATIVE 01/06/2021 1404   Sepsis Labs Invalid input(s): PROCALCITONIN,  WBC,  LACTICIDVEN Microbiology Recent Results (from the past 240 hour(s))  Resp Panel by RT-PCR (Flu A&B, Covid) Nasopharyngeal Swab     Status: None   Collection Time: 10/08/2021 12:46 PM   Specimen: Nasopharyngeal Swab; Nasopharyngeal(NP) swabs in vial transport medium  Result Value Ref Range Status   SARS Coronavirus 2 by RT PCR NEGATIVE NEGATIVE Final    Comment: (NOTE) SARS-CoV-2 target nucleic acids are NOT DETECTED.  The SARS-CoV-2 RNA is generally detectable in upper respiratory specimens during the acute phase of infection. The lowest concentration of SARS-CoV-2 viral copies this assay can detect is 138 copies/mL. A negative result does not preclude SARS-Cov-2 infection and should not be used as the sole basis for treatment or other patient management decisions. A negative result may occur with  improper specimen collection/handling, submission of specimen other than nasopharyngeal swab, presence of viral mutation(s) within the areas targeted by this assay, and inadequate number of viral copies(<138 copies/mL). A negative result must be combined with clinical observations, patient history, and epidemiological information. The expected result is Negative.  Fact Sheet for Patients:  EntrepreneurPulse.com.au  Fact Sheet for Healthcare Providers:  IncredibleEmployment.be  This test is no t yet approved or cleared by the [place] FDA and  has been authorized for detection and/or diagnosis of SARS-CoV-2 by FDA under an Emergency Use Authorization (EUA). This EUA will remain  in effect  (meaning this test can be used) for the duration of the COVID-19 declaration under Section 564(b)(1) of the Act, 21 U.S.C.section 360bbb-3(b)(1), unless the authorization is terminated  or revoked sooner.       Influenza A by PCR NEGATIVE NEGATIVE Final   Influenza B by PCR NEGATIVE NEGATIVE Final    Comment: (NOTE) The Xpert Xpress SARS-CoV-2/FLU/RSV plus assay is intended as an aid in the diagnosis of influenza from Nasopharyngeal swab specimens and should not be used as a sole basis for treatment. Nasal washings and aspirates are unacceptable for Xpert Xpress SARS-CoV-2/FLU/RSV testing.  Fact Sheet for Patients: EntrepreneurPulse.com.au  Fact Sheet for Healthcare Providers: IncredibleEmployment.be  This test is not yet approved or cleared by the [place] FDA and has been authorized for detection and/or diagnosis of SARS-CoV-2 by FDA under an Emergency Use Authorization (EUA). This EUA will remain in effect (meaning this test can be used) for the duration of the COVID-19 declaration under Section 564(b)(1) of the Act, 21 U.S.C. section 360bbb-3(b)(1), unless the authorization is terminated or revoked.  Performed at [facility], [address], [place], [place] [address]      Time coordinating discharge: 35 minutes  SIGNED:   Rodena Goldmann, DO [facility] 10/15/2021, 9:43 AM  If 7PM-7AM, please contact night-coverage [email or web address]

## 2021-10-15 NOTE — Progress Notes (Signed)
Patient still too sleepy and unable to use DPI. She did not appear to be in respiratory distress, her breathing was unlabored with symmetrical chest rise.

## 2021-10-16 DIAGNOSIS — J441 Chronic obstructive pulmonary disease with (acute) exacerbation: Secondary | ICD-10-CM | POA: Diagnosis not present

## 2021-10-16 MED ORDER — MORPHINE SULFATE (PF) 2 MG/ML IV SOLN
1.0000 mg | INTRAVENOUS | Status: DC | PRN
  Administered 2021-10-16 – 2021-10-18 (×6): 2 mg via INTRAVENOUS
  Filled 2021-10-16 (×6): qty 1

## 2021-10-16 NOTE — Progress Notes (Signed)
Patient noted to have some secretions this AM.  Noted to be slightly more agitated as well as awake this AM.  Foley catheter placed yesterday due to urinary retention.  Plan will be to ask for respiratory to suction and ensure she receives glycopyrrolate to help dry secretions.  Continue comfort measures.  Family at bedside wondering if inpatient hospice might be more appropriate.  I have discussed that we could keep the patient until a.m. for reevaluation by palliative.  Please refer to discharge summary dictated 12/31 for full details.  Total care time: 20 minutes.

## 2021-10-16 NOTE — TOC Progression Note (Addendum)
Transition of Care [facility]) - Progression Note    Patient Details  Name: Theresa Bush MRN: [id number] Date of Birth: [birth year]  Transition of Care [facility]) CM/SW Contact  Boneta Lucks, RN Phone Number: 10/16/2021, 10:37 AM  Clinical Narrative:   Discharge home held. Patient is declining and more residential appropriate. TOC contacted [facility] they will send a nurse to assess patient. RN/MD updated.   Addendum: Hospice called to question discharge plan. There last note states family discussion needed to decide on Hospice. They do not have enough staff today to send a nurse to assess. TOC will follow up with family.   Addendum #2-Spoke with Crystal, they agreed to Hospice on Friday and was awaiting DME delivery. She now agrees with MD, patient patient is residential appropriate. They will not be able to care for her at home. She is not eating or drinking. They would like her assess for residential.  Plan is for hospice RN to assess tomorrow.   Expected Discharge Plan: [place] Barriers to Discharge: Continued Medical Work up  Expected Discharge Plan and Services Expected Discharge Plan: [place] In-house Referral: Clinical Social Work Discharge Planning Services: CM Consult Post Acute Care Choice: Hospice Living arrangements for the past 2 months: Single Family Home Expected Discharge Date: 10/15/21                    Readmission Risk Interventions Readmission Risk Prevention Plan 10/16/2021  Transportation Screening Complete  HRI or Home Care Consult Complete  Social Work Consult for [place] Planning/Counseling Complete  Palliative Care Screening Complete  Medication Review Press photographer) Complete  Some recent data might be hidden

## 2021-10-16 DEATH — deceased

## 2021-10-17 DIAGNOSIS — J441 Chronic obstructive pulmonary disease with (acute) exacerbation: Secondary | ICD-10-CM | POA: Diagnosis not present

## 2021-10-17 NOTE — Progress Notes (Signed)
Patient seen and evaluated with family at bedside.  Evaluation for residential hospice facility currently pending.  No other acute overnight events noted.  Please refer to discharge summary dictated 12/31 for full details.  Appreciate palliative evaluation.  Total care time: 15 minutes.

## 2021-10-17 NOTE — TOC Progression Note (Signed)
Transition of Care [facility]) - Progression Note    Patient Details  Name: Theresa Bush MRN: [id number] Date of Birth: 04/28/1926  Transition of Care [facility]) CM/SW [place], [place] Phone Number: 10/17/2021, 3:51 PM  Clinical Narrative:    CSW made call to hospice of [facility]. Hospice is closed today. CSW to follow up in the morning with Hospice. TOC to follow.   Expected Discharge Plan: [place] Barriers to Discharge: Continued Medical Work up  Expected Discharge Plan and Services Expected Discharge Plan: [place] In-house Referral: Clinical Social Work Discharge Planning Services: CM Consult Post Acute Care Choice: Hospice Living arrangements for the past 2 months: Single Family Home Expected Discharge Date: 10/15/21                                     Social Determinants of Health (SDOH) Interventions    Readmission Risk Interventions Readmission Risk Prevention Plan 10/16/2021  Transportation Screening Complete  HRI or Home Care Consult Complete  Social Work Consult for [place] Planning/Counseling Complete  Palliative Care Screening Complete  Medication Review Press photographer) Complete  Some recent data might be hidden

## 2021-10-17 NOTE — Progress Notes (Addendum)
Palliative: Chart review completed.  Mrs. Theresa Bush is now being referred to residential hospice with [facility].  Transition of care team is working for placement.  Unfortunately, [facility] is closed today. Conference with transition of care team related to patient condition, needs, disposition. PMT to continue to follow.  Plan: Family is requesting comfort and dignity at end-of-life, residential hospice with [facility].  No charge Theresa Carmel, NP Palliative medicine team Team phone [phone] Greater than 50% of this time was spent counseling and coordinating care related to the above assessment and plan.

## 2021-10-18 DIAGNOSIS — J441 Chronic obstructive pulmonary disease with (acute) exacerbation: Secondary | ICD-10-CM | POA: Diagnosis not present

## 2021-10-19 ENCOUNTER — Ambulatory Visit: Payer: Medicare HMO | Admitting: Internal Medicine

## 2021-11-16 NOTE — Progress Notes (Signed)
Patient has expired. Patient pronounced for charge nurse and primary RN.

## 2021-11-16 NOTE — Progress Notes (Signed)
Patient family member notified this nurse in the hallway that he thinks his mother has passed away. Upon assessment, patient noted to not have a pulse, nor was she breathing. Patient pronounced at 4:25AM. Doctor notified. Family at bedside.

## 2021-11-16 NOTE — Death Summary Note (Signed)
DEATH SUMMARY   Patient Details  Name: Theresa Bush MRN: [id number] DOB: 01-Apr-1926  Admission/Discharge Information   Admit Date:  10/15/2021  Date of Death: Date of Death: 10/21/21  Time of Death: Time of Death: 0425  Length of Stay: 5  Referring Physician: Lindell Spar, MD   Reason(s) for Hospitalization  Acute COPD exacerbation with mild acute diastolic CHF exacerbation.  Diagnoses  Preliminary cause of death:  Secondary Diagnoses (including complications and co-morbidities):  Principal Problem:   COPD with acute exacerbation ([place]) Active Problems:   Essential hypertension   Hypothyroidism   COPD (chronic obstructive pulmonary disease) (H[facility])   CHF (congestive heart failure) ([facility])   Dementia ([place])   Brief Hospital Course (including significant findings, care, treatment, and services provided and events leading to death)  Theresa Bush is a 88 y.o. year old female who is a reformed smoker with past medical history relevant for uncontrolled hypertension, dementia, COPD, presumed CHF with increasing lower extremity swelling and shortness of breath and fatigue.  Patient was admitted with acute COPD exacerbation as well as suspicion of mild CHF exacerbation.  She was started on IV steroids as well as breathing treatments and Lasix which has now been discontinued this a.m. due to creatinine elevation.  She continued to have some ongoing somnolence and confusion with intermittent agitation for which she was given Xanax.  Family members were concerned about her ongoing decline over the last 1 year and were interested in considering hospice care as her long-term prognosis seemed quite poor.  She was noted to have worsening hyponatremia as well as creatinine elevation in the setting of poor oral intake.  Discussion was had with family members on 12/30 with decision to transition to comfort measures only.  She was maintained on comfort measures through 1/3 while she was  awaiting a decision to go either home with home hospice versus facility.  She ultimately had worsening decline and family members wanted to consider a facility which was appropriate.  Unfortunately, prior to arranging disposition, she expired in the hospital on 2021-10-21 at [facility].  Pertinent Labs and Studies  Significant Diagnostic Studies DG Chest Port 1 View  Result Date: 10-15-21 CLINICAL DATA:  Shortness of breath. EXAM: PORTABLE CHEST 1 VIEW COMPARISON:  Mar 08, 2021. FINDINGS: The heart size and mediastinal contours are within normal limits. Mild bibasilar subsegmental atelectasis or edema is noted. The visualized skeletal structures are unremarkable. IMPRESSION: Mild bibasilar subsegmental atelectasis or edema. Aortic Atherosclerosis (ICD10-I70.0). Electronically Signed   By: Marijo Conception M.D.   On: Oct 15, 2021 13:13   ECHOCARDIOGRAM COMPLETE  Result Date: 10/13/2021    ECHOCARDIOGRAM REPORT   Patient Name:   Theresa Bush Date of Exam: 10/13/2021 Medical Rec #:  [id number]           Height:       62.0 in Accession #:    [id number]          Weight:       102.5 lb Date of Birth:  1925/10/21          BSA:          1.439 m Patient Age:    88 years            BP:           107/54 mmHg Patient Gender: F                   HR:  65 bpm. Exam Location:  [facility] Procedure: 2D Echo, Cardiac Doppler and Color Doppler Indications:    Dyspnea  History:        Patient has no prior history of Echocardiogram examinations.                 CHF, COPD; Risk Factors:Hypertension.  Sonographer:    Wenda Low Referring Phys: [place]  1. SAM with LVOT gradient noted in setting of severe LVH cosistent with hypertrophic physiology Resting gradient not well defined on doppler but appears low with peak velocity < 31m/sec Given patients age would suggest beta blocker Rx and no diuretics if  possible . Left ventricular ejection fraction, by estimation, is 60 to 65%. The left  ventricle has normal function. The left ventricle has no regional wall motion abnormalities. There is severe left ventricular hypertrophy. Left ventricular diastolic parameters are consistent with Grade II diastolic dysfunction (pseudonormalization). Elevated left ventricular end-diastolic pressure.  2. Right ventricular systolic function is normal. The right ventricular size is normal. There is normal pulmonary artery systolic pressure.  3. The pericardial effusion is posterior to the left ventricle.  4. The mitral valve is abnormal. Mild mitral valve regurgitation. No evidence of mitral stenosis.  5. The aortic valve is tricuspid. There is mild calcification of the aortic valve. Aortic valve regurgitation is mild. Aortic valve sclerosis/calcification is present, without any evidence of aortic stenosis.  6. The inferior vena cava is normal in size with greater than 50% respiratory variability, suggesting right atrial pressure of 3 mmHg. FINDINGS  Left Ventricle: SAM with LVOT gradient noted in setting of severe LVH cosistent with hypertrophic physiology Resting gradient not well defined on doppler but appears low with peak velocity < 98m/sec Given patients age would suggest beta blocker Rx and no  diuretics if possible. Left ventricular ejection fraction, by estimation, is 60 to 65%. The left ventricle has normal function. The left ventricle has no regional wall motion abnormalities. The left ventricular internal cavity size was normal in size. There is severe left ventricular hypertrophy. Left ventricular diastolic parameters are consistent with Grade II diastolic dysfunction (pseudonormalization). Elevated left ventricular end-diastolic pressure. Right Ventricle: The right ventricular size is normal. No increase in right ventricular wall thickness. Right ventricular systolic function is normal. There is normal pulmonary artery systolic pressure. The tricuspid regurgitant velocity is 2.04 m/s, and  with an assumed  right atrial pressure of 3 mmHg, the estimated right ventricular systolic pressure is [id number] mmHg. Left Atrium: Left atrial size was normal in size. Right Atrium: Right atrial size was normal in size. Pericardium: Trivial pericardial effusion is present. The pericardial effusion is posterior to the left ventricle. Mitral Valve: The mitral valve is abnormal. There is mild thickening of the mitral valve leaflet(s). There is mild calcification of the mitral valve leaflet(s). Mild mitral valve regurgitation. No evidence of mitral valve stenosis. MV peak gradient, 5.3 mmHg. The mean mitral valve gradient is 2.0 mmHg. Tricuspid Valve: The tricuspid valve is normal in structure. Tricuspid valve regurgitation is mild . No evidence of tricuspid stenosis. Aortic Valve: The aortic valve is tricuspid. There is mild calcification of the aortic valve. Aortic valve regurgitation is mild. Aortic valve sclerosis/calcification is present, without any evidence of aortic stenosis. Aortic valve mean gradient measures 4.0 mmHg. Aortic valve peak gradient measures 8.3 mmHg. Aortic valve area, by VTI measures 1.85 cm. Pulmonic Valve: The pulmonic valve was normal in structure. Pulmonic valve regurgitation is not visualized. No evidence of  pulmonic stenosis. Aorta: The aortic root is normal in size and structure. Venous: The inferior vena cava is normal in size with greater than 50% respiratory variability, suggesting right atrial pressure of 3 mmHg. IAS/Shunts: No atrial level shunt detected by color flow Doppler.  LEFT VENTRICLE PLAX 2D LVIDd:         4.00 cm   Diastology LVIDs:         1.90 cm   LV e' medial:    3.30 cm/s LV PW:         1.70 cm   LV E/e' medial:  26.4 LV IVS:        1.80 cm   LV e' lateral:   5.10 cm/s LVOT diam:     1.90 cm   LV E/e' lateral: 17.1 LV SV:         69 LV SV Index:   48 LVOT Area:     2.84 cm  RIGHT VENTRICLE RV Basal diam:  3.40 cm RV Mid diam:    2.80 cm LEFT ATRIUM           Index        RIGHT ATRIUM            Index LA diam:      3.70 cm 2.57 cm/m   RA Area:     18.00 cm LA Vol (A4C): 28.4 ml 19.74 ml/m  RA Volume:   48.50 ml  33.70 ml/m  AORTIC VALVE                    PULMONIC VALVE AV Area (Vmax):    2.03 cm     PV Vmax:       0.76 m/s AV Area (Vmean):   2.01 cm     PV Peak grad:  2.3 mmHg AV Area (VTI):     1.85 cm AV Vmax:           144.00 cm/s AV Vmean:          95.700 cm/s AV VTI:            0.376 m AV Peak Grad:      8.3 mmHg AV Mean Grad:      4.0 mmHg LVOT Vmax:         103.00 cm/s LVOT Vmean:        67.900 cm/s LVOT VTI:          0.245 m LVOT/AV VTI ratio: 0.65  AORTA Ao Root diam: 3.00 cm Ao Asc diam:  3.40 cm MITRAL VALVE                TRICUSPID VALVE MV Area (PHT): 3.06 cm     TR Peak grad:   16.6 mmHg MV Area VTI:   1.86 cm     TR Vmax:        204.00 cm/s MV Peak grad:  5.3 mmHg MV Mean grad:  2.0 mmHg     SHUNTS MV Vmax:       1.15 m/s     Systemic VTI:  0.24 m MV Vmean:      62.5 cm/s    Systemic Diam: 1.90 cm MV Decel Time: 248 msec MV E velocity: 87.00 cm/s MV A velocity: 108.00 cm/s MV E/A ratio:  0.81 Jenkins Rouge MD Electronically signed by Jenkins Rouge MD Signature Date/Time: 10/13/2021/10:58:05 AM    Final     Microbiology Recent Results (from the past 240 hour(s))  Resp Panel by  RT-PCR (Flu A&B, Covid) Nasopharyngeal Swab     Status: None   Collection Time: 10/04/2021 12:46 PM   Specimen: Nasopharyngeal Swab; Nasopharyngeal(NP) swabs in vial transport medium  Result Value Ref Range Status   SARS Coronavirus 2 by RT PCR NEGATIVE NEGATIVE Final    Comment: (NOTE) SARS-CoV-2 target nucleic acids are NOT DETECTED.  The SARS-CoV-2 RNA is generally detectable in upper respiratory specimens during the acute phase of infection. The lowest concentration of SARS-CoV-2 viral copies this assay can detect is 138 copies/mL. A negative result does not preclude SARS-Cov-2 infection and should not be used as the sole basis for treatment or other patient management decisions. A  negative result may occur with  improper specimen collection/handling, submission of specimen other than nasopharyngeal swab, presence of viral mutation(s) within the areas targeted by this assay, and inadequate number of viral copies(<138 copies/mL). A negative result must be combined with clinical observations, patient history, and epidemiological information. The expected result is Negative.  Fact Sheet for Patients:  EntrepreneurPulse.com.au  Fact Sheet for Healthcare Providers:  IncredibleEmployment.be  This test is no t yet approved or cleared by the [place] FDA and  has been authorized for detection and/or diagnosis of SARS-CoV-2 by FDA under an Emergency Use Authorization (EUA). This EUA will remain  in effect (meaning this test can be used) for the duration of the COVID-19 declaration under Section 564(b)(1) of the Act, 21 U.S.C.section 360bbb-3(b)(1), unless the authorization is terminated  or revoked sooner.       Influenza A by PCR NEGATIVE NEGATIVE Final   Influenza B by PCR NEGATIVE NEGATIVE Final    Comment: (NOTE) The Xpert Xpress SARS-CoV-2/FLU/RSV plus assay is intended as an aid in the diagnosis of influenza from Nasopharyngeal swab specimens and should not be used as a sole basis for treatment. Nasal washings and aspirates are unacceptable for Xpert Xpress SARS-CoV-2/FLU/RSV testing.  Fact Sheet for Patients: EntrepreneurPulse.com.au  Fact Sheet for Healthcare Providers: IncredibleEmployment.be  This test is not yet approved or cleared by the [place] FDA and has been authorized for detection and/or diagnosis of SARS-CoV-2 by FDA under an Emergency Use Authorization (EUA). This EUA will remain in effect (meaning this test can be used) for the duration of the COVID-19 declaration under Section 564(b)(1) of the Act, 21 U.S.C. section 360bbb-3(b)(1), unless the authorization  is terminated or revoked.  Performed at [facility], [address] [place], [place] [address]     Lab Basic Metabolic Panel: Recent Labs  Lab 09/24/2021 1242 10/13/21 0604 10/14/21 0555  NA 127* 128* 121*  K 3.8 3.7 4.2  CL 95* 93* 89*  CO2 23 24 22   GLUCOSE 118* 146* 130*  BUN 20 32* 60*  CREATININE 0.75 1.31* 2.06*  CALCIUM 9.0 8.9 8.6*  MG  --   --  1.9   Liver Function Tests: No results for input(s): AST, ALT, ALKPHOS, BILITOT, PROT, ALBUMIN in the last 168 hours. No results for input(s): LIPASE, AMYLASE in the last 168 hours. No results for input(s): AMMONIA in the last 168 hours. CBC: Recent Labs  Lab 09/22/2021 1242 10/13/21 0604 10/14/21 0555  WBC 13.8* 10.7* 9.8  NEUTROABS 12.3*  --   --   HGB 12.6 12.1 11.0*  HCT 37.7 35.8* 32.2*  MCV 96.7 96.2 94.7  PLT 185 181 172   Cardiac Enzymes: No results for input(s): CKTOTAL, CKMB, CKMBINDEX, TROPONINI in the last 168 hours. Sepsis Labs: Recent Labs  Lab 10/07/2021 1242 10/13/21 0604 10/14/21  0555  WBC 13.8* 10.7* 9.8    Procedures/Operations  None   Tam Delisle D Grigor Lipschutz 10-Nov-2021, 7:03 AM

## 2021-11-16 DEATH — deceased

## 2022-08-10 IMAGING — CT CT HEAD W/O CM
4 of 7 series · 17 of 47 positions shown, 19 images · non-contrast
Comparison: None.

CLINICAL DATA: Altered mental status.

EXAM:
CT HEAD WITHOUT CONTRAST
TECHNIQUE: Contiguous axial images were obtained from the base of the skull
through the vertex without intravenous contrast.

[Series 2: head w o · axial · 0.40mm/px · z∈[+99,+219]mm · 6 of 34 slices shown, 8 images]
[im 5/34  brain]
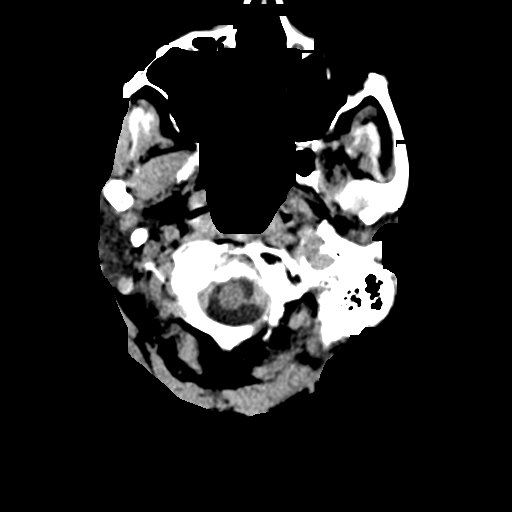
[im 5/34  bone]
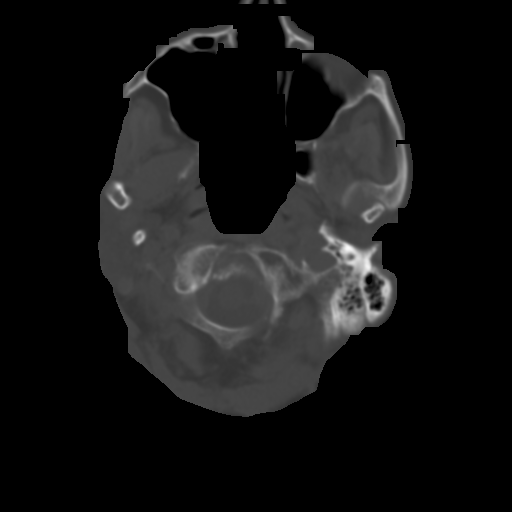
[im 10/34  brain]
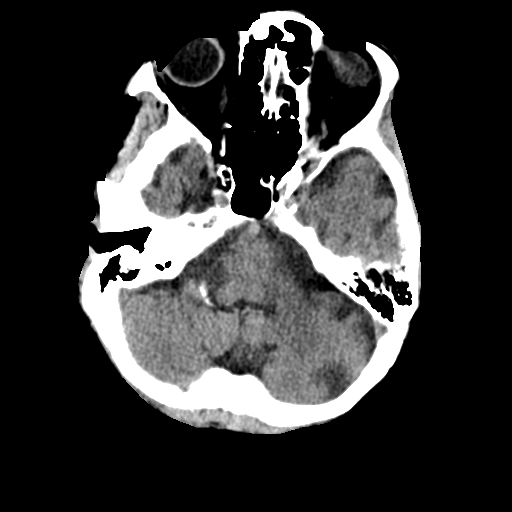
[im 15/34  brain]
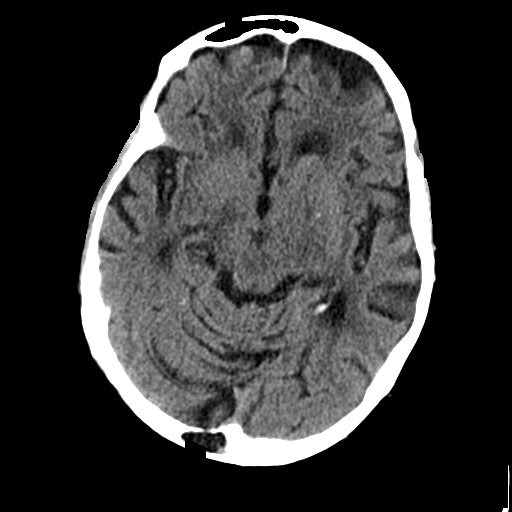
[im 19/34  brain]
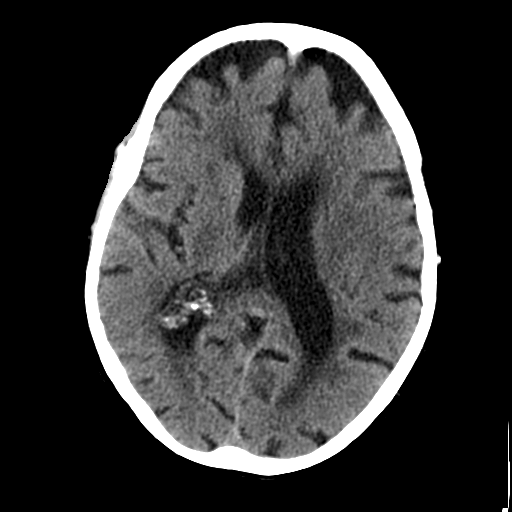
[im 24/34  brain]
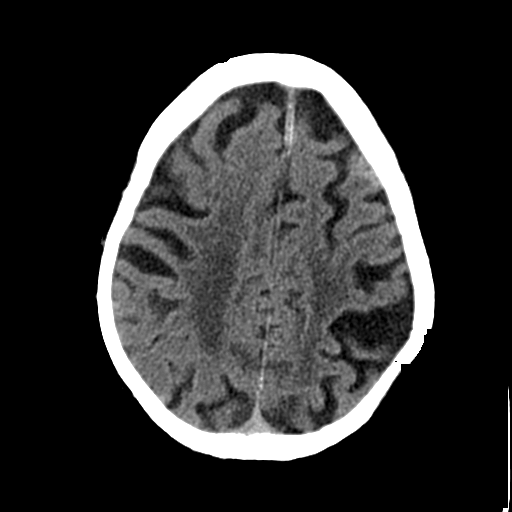
[im 24/34  bone]
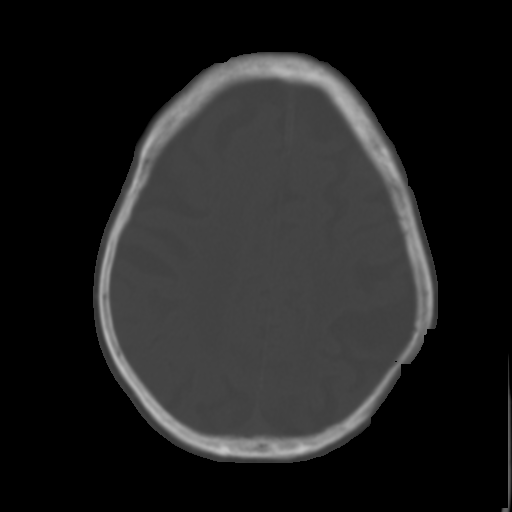
[im 29/34  brain]
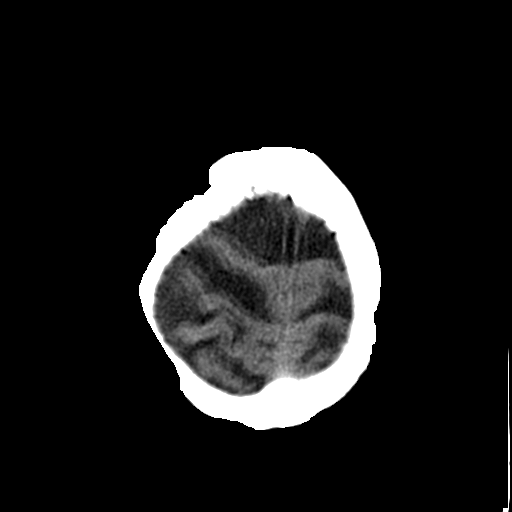

[Series 5: head bone · axial · 0.40mm/px · z∈[+97,+171]mm · 5 of 84 slices shown]
[im 10/84  bone]
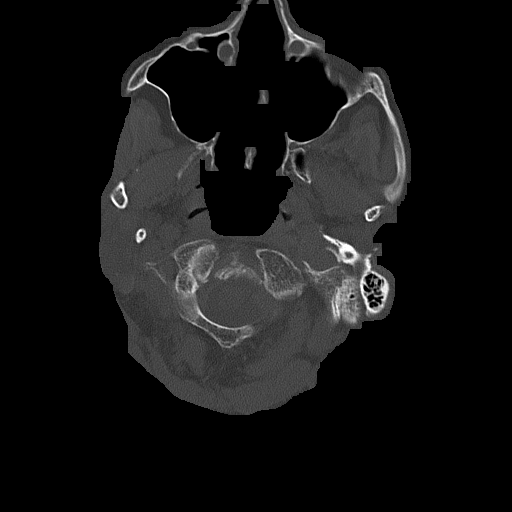
[im 19/84  bone]
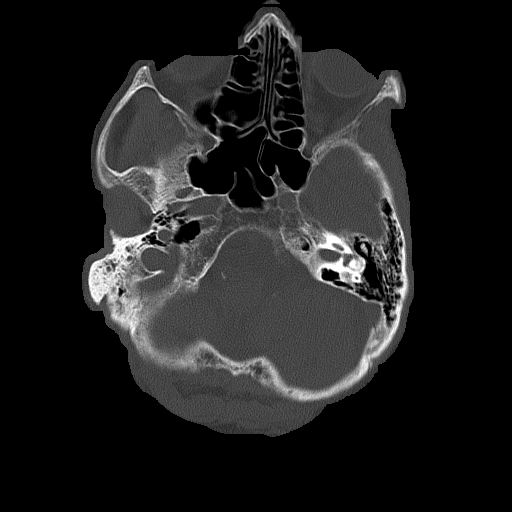
[im 28/84  bone]
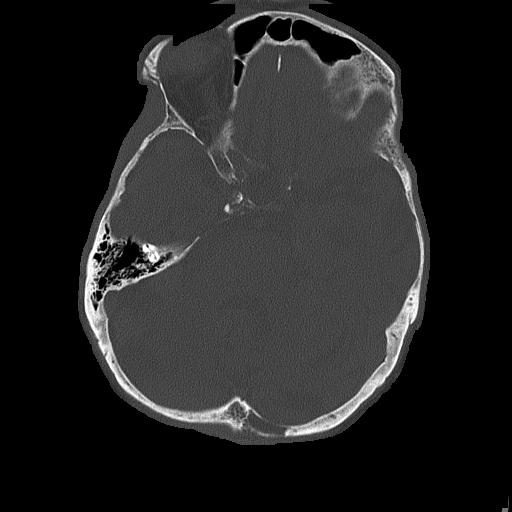
[im 37/84  bone]
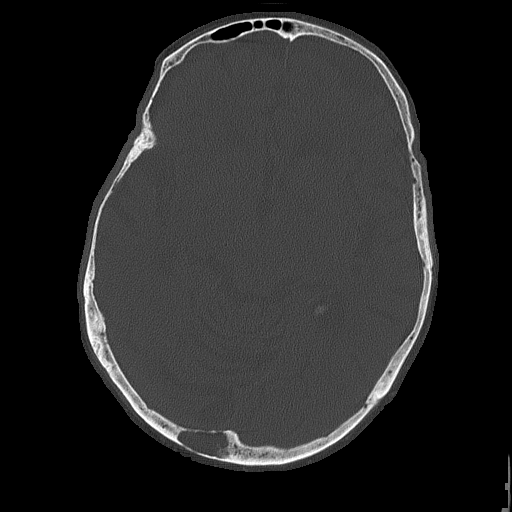
[im 47/84  bone]
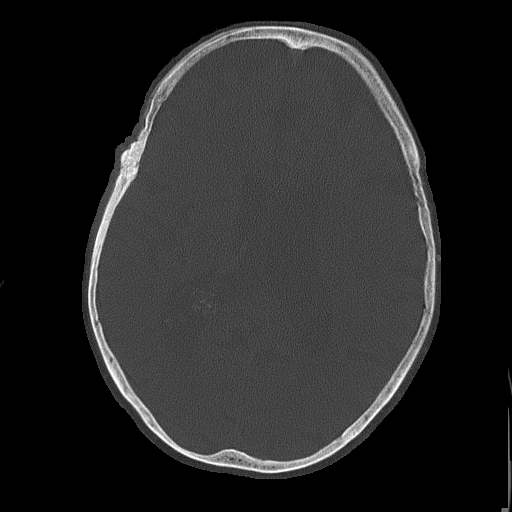

[Series 6: coronal soft · coronal · 0.36mm/px · 3 of 81 slices shown]
[im 21/81  brain]
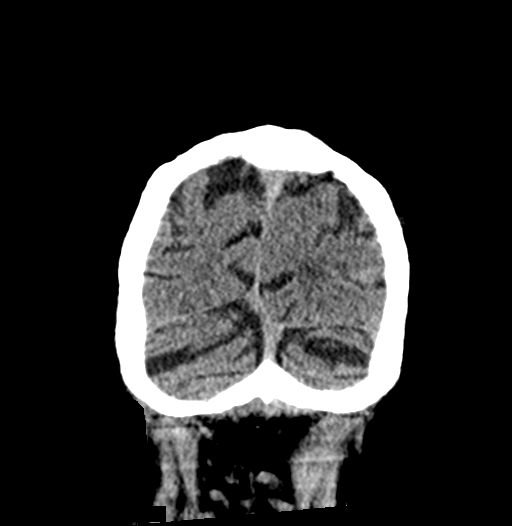
[im 41/81  brain]
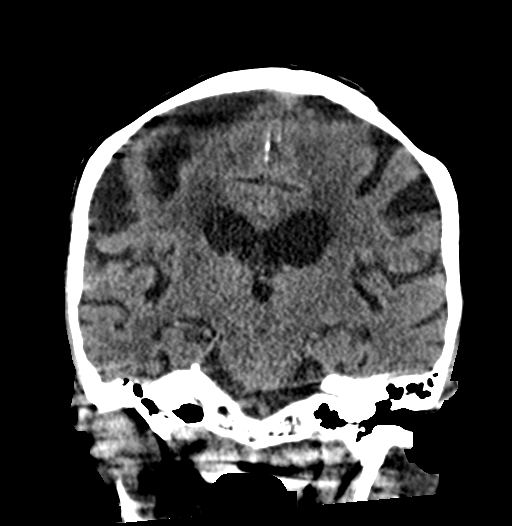
[im 61/81  brain]
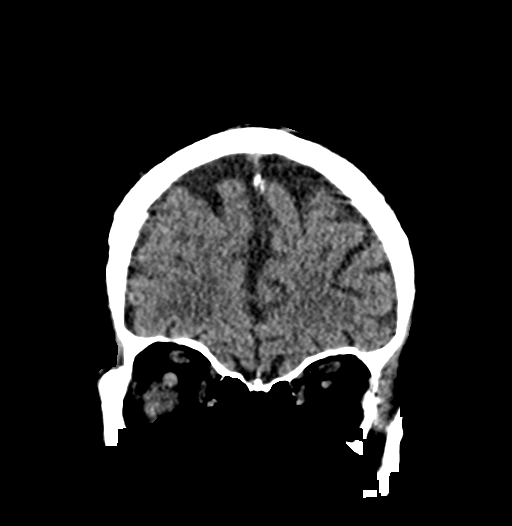

[Series 7: sagittal soft · sagittal · 0.37mm/px · 3 of 58 slices shown]
[im 13/58  brain]
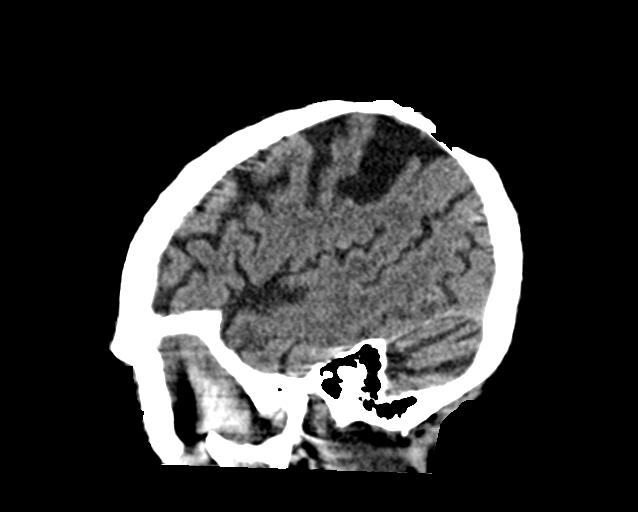
[im 26/58  brain]
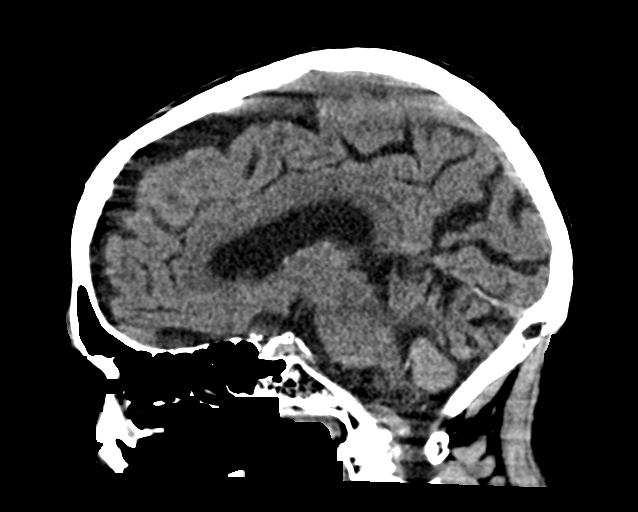
[im 39/58  brain]
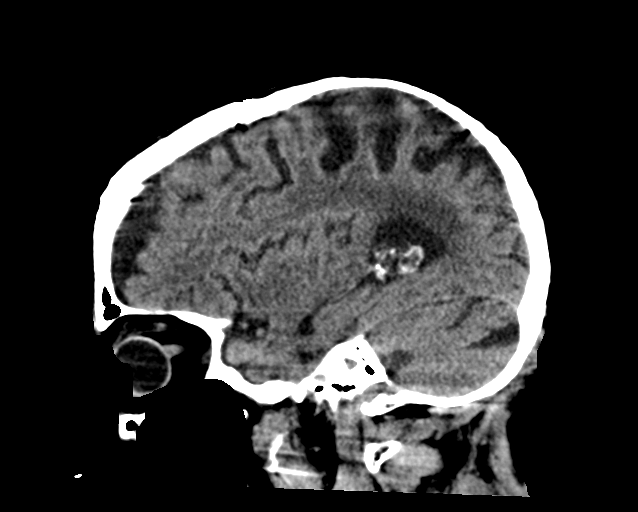

[17 of 47 positions shown; findings below may reference images not displayed]

FINDINGS: Brain: Mild diffuse cortical atrophy is noted. Mild chronic ischemic
white matter disease is noted. No mass effect or midline shift is
noted. Ventricular size is within normal limits. There is no
evidence of mass lesion, hemorrhage or acute infarction.

Vascular: No hyperdense vessel or unexpected calcification.

Skull: Normal. Negative for fracture or focal lesion.

Sinuses/Orbits: No acute finding.

Other: None.
IMPRESSION: Mild diffuse cortical atrophy. Mild chronic ischemic white matter
disease. No acute intracranial abnormality seen.
